# Patient Record
Sex: Male | Born: 1990 | Hispanic: Refuse to answer | Marital: Married | Smoking: Former smoker
Health system: Southern US, Community
[De-identification: ages and names within clinical notes are randomized; demographics above are authoritative.]

## PROBLEM LIST (undated history)

## (undated) DIAGNOSIS — R519 Headache, unspecified: Secondary | ICD-10-CM

## (undated) DIAGNOSIS — U071 COVID-19: Secondary | ICD-10-CM

## (undated) DIAGNOSIS — R51 Headache: Secondary | ICD-10-CM

## (undated) DIAGNOSIS — F329 Major depressive disorder, single episode, unspecified: Secondary | ICD-10-CM

## (undated) DIAGNOSIS — F32A Depression, unspecified: Secondary | ICD-10-CM

## (undated) HISTORY — PX: NO PAST SURGERIES: SHX2092

## (undated) HISTORY — DX: Depression, unspecified: F32.A

## (undated) HISTORY — DX: COVID-19: U07.1

## (undated) HISTORY — DX: Headache, unspecified: R51.9

## (undated) HISTORY — DX: Headache: R51

## (undated) HISTORY — DX: Major depressive disorder, single episode, unspecified: F32.9

---

## 2012-12-22 ENCOUNTER — Emergency Department: Payer: Self-pay | Admitting: Emergency Medicine

## 2013-12-08 ENCOUNTER — Emergency Department: Payer: Self-pay | Admitting: Emergency Medicine

## 2014-04-03 ENCOUNTER — Emergency Department (HOSPITAL_COMMUNITY)
Admission: EM | Admit: 2014-04-03 | Discharge: 2014-04-03 | Disposition: A | Discharge status: Home / Self Care | Payer: No Typology Code available for payment source | Attending: Dermatology | Admitting: Dermatology

## 2014-04-03 ENCOUNTER — Encounter (HOSPITAL_COMMUNITY): Payer: Self-pay | Admitting: Emergency Medicine

## 2014-04-03 DIAGNOSIS — F419 Anxiety disorder, unspecified: Secondary | ICD-10-CM | POA: Insufficient documentation

## 2014-04-03 DIAGNOSIS — F329 Major depressive disorder, single episode, unspecified: Secondary | ICD-10-CM | POA: Diagnosis not present

## 2014-04-03 DIAGNOSIS — F32A Depression, unspecified: Secondary | ICD-10-CM

## 2014-04-03 DIAGNOSIS — Z79899 Other long term (current) drug therapy: Secondary | ICD-10-CM | POA: Insufficient documentation

## 2014-04-03 DIAGNOSIS — Z720 Tobacco use: Secondary | ICD-10-CM | POA: Diagnosis not present

## 2014-04-03 DIAGNOSIS — Z008 Encounter for other general examination: Secondary | ICD-10-CM | POA: Diagnosis present

## 2014-04-03 LAB — RAPID URINE DRUG SCREEN, HOSP PERFORMED
Amphetamines: NOT DETECTED
BENZODIAZEPINES: NOT DETECTED
Barbiturates: NOT DETECTED
Cocaine: NOT DETECTED
Opiates: NOT DETECTED
TETRAHYDROCANNABINOL: POSITIVE — AB

## 2014-04-03 LAB — COMPREHENSIVE METABOLIC PANEL
ALT: 18 U/L (ref 0–53)
AST: 27 U/L (ref 0–37)
Albumin: 4.9 g/dL (ref 3.5–5.2)
Alkaline Phosphatase: 64 U/L (ref 39–117)
Anion gap: 9 (ref 5–15)
BUN: 9 mg/dL (ref 6–23)
CALCIUM: 9.2 mg/dL (ref 8.4–10.5)
CO2: 26 mmol/L (ref 19–32)
Chloride: 103 mmol/L (ref 96–112)
Creatinine, Ser: 0.92 mg/dL (ref 0.50–1.35)
GFR calc Af Amer: >90 mL/min (ref >90)
GFR calc non Af Amer: >90 mL/min (ref >90)
Glucose, Bld: 100 mg/dL — ABNORMAL HIGH (ref 70–99)
POTASSIUM: 3.5 mmol/L (ref 3.5–5.1)
Sodium: 138 mmol/L (ref 135–145)
Total Bilirubin: 0.7 mg/dL (ref 0.3–1.2)
Total Protein: 8.2 g/dL (ref 6.0–8.3)

## 2014-04-03 LAB — CBC
HEMATOCRIT: 49 % (ref 39.0–52.0)
Hemoglobin: 16.6 g/dL (ref 13.0–17.0)
MCH: 31.9 pg (ref 26.0–34.0)
MCHC: 33.9 g/dL (ref 30.0–36.0)
MCV: 94 fL (ref 78.0–100.0)
Platelets: 315 10*3/uL (ref 150–400)
RBC: 5.21 MIL/uL (ref 4.22–5.81)
RDW: 12.5 % (ref 11.5–15.5)
WBC: 10 10*3/uL (ref 4.0–10.5)

## 2014-04-03 LAB — ETHANOL: Alcohol, Ethyl (B): <5 mg/dL (ref 0–9)

## 2014-04-03 LAB — SALICYLATE LEVEL

## 2014-04-03 LAB — ACETAMINOPHEN LEVEL: Acetaminophen (Tylenol), Serum: <10 ug/mL — ABNORMAL LOW (ref 10–30)

## 2014-04-03 MED ORDER — IBUPROFEN 200 MG PO TABS: 600.0000 mg | ORAL_TABLET | Freq: Three times a day (TID) | ORAL | Status: DC | PRN

## 2014-04-03 MED ORDER — LORAZEPAM 1 MG PO TABS: 1.0000 mg | ORAL_TABLET | Freq: Three times a day (TID) | ORAL | Status: DC | PRN

## 2014-04-03 MED ORDER — ACETAMINOPHEN 325 MG PO TABS: 650.0000 mg | ORAL_TABLET | ORAL | Status: DC | PRN

## 2014-04-03 NOTE — ED Provider Notes (Addendum)
CSN: [id number]     Arrival date & time 04/03/14  1306 History   First MD Initiated Contact with Patient 04/03/14 1506     Chief Complaint  Patient presents with  . Medical Clearance     (Consider location/radiation/quality/duration/timing/severity/associated sxs/prior Treatment) The history is provided by the patient.  pt with intermittent hx of feelings of anxiety and depression (although no specific beh health dx in past), c/o feeling more depressed in the past couple months. Notes recent stressors related to series of small poor decisions, also a DUI, breakup of relationship, and loss of good job and current unrewarding job.  Occasional THC use. Rare binge etoh use. States has had random, fleeting thoughts of suicide in past, but denies plan or any attempt to harm self.  States physical health at baseline. Is eating/drinking. No wt change. No recent febrile illness. No headaches.       History reviewed. No pertinent past medical history. History reviewed. No pertinent past surgical history. History reviewed. No pertinent family history. History  Substance Use Topics  . Smoking status: Current Every Day Smoker -- 1.00 packs/day    Types: Cigarettes  . Smokeless tobacco: Not on file  . Alcohol Use: 1.8 oz/week    3 Cans of beer per week    Review of Systems  Constitutional: Negative for fever.  HENT: Negative for sore throat.   Eyes: Negative for redness.  Respiratory: Negative for shortness of breath.   Cardiovascular: Negative for chest pain.  Gastrointestinal: Negative for vomiting and abdominal pain.  Endocrine: Negative for polyuria.  Genitourinary: Negative for flank pain.  Musculoskeletal: Negative for back pain and neck pain.  Skin: Negative for rash.  Neurological: Negative for headaches.  Hematological: Does not bruise/bleed easily.  Psychiatric/Behavioral: Positive for dysphoric mood. Negative for self-injury.      Allergies  Review of patient's allergies  indicates no known allergies.  Home Medications   Prior to Admission medications   Medication Sig Start Date End Date Taking? Authorizing Provider  Ascorbic Acid 125 MG CHEW Chew 250 mg by mouth daily.   Yes Historical Provider, MD  hydroxypropyl methylcellulose / hypromellose (ISOPTO TEARS / GONIOVISC) 2.5 % ophthalmic solution Place 1 drop into both eyes 3 (three) times daily as needed for dry eyes.   Yes Historical Provider, MD   BP 142/97 mmHg  Pulse 83  Temp(Src) 97 F (36.1 C) (Oral)  Resp 17  SpO2 100% Physical Exam  Constitutional: He is oriented to person, place, and time. He appears well-developed and well-nourished. No distress.  HENT:  Head: Atraumatic.  Eyes: Conjunctivae are normal. No scleral icterus.  Neck: Neck supple. No tracheal deviation present.  Cardiovascular: Normal rate, regular rhythm, normal heart sounds and intact distal pulses.   Pulmonary/Chest: Effort normal and breath sounds normal. No accessory muscle usage. No respiratory distress.  Abdominal: He exhibits no distension.  Musculoskeletal: Normal range of motion. He exhibits no edema.  Neurological: He is alert and oriented to person, place, and time.  Steady gait. No tremor or shakes.   Skin: Skin is warm and dry.  Psychiatric:  Depressed mood.   Nursing note and vitals reviewed.   ED Course  Procedures (including critical care time) Labs Review   Results for orders placed or performed during the hospital encounter of 04/03/14  Acetaminophen level  Result Value Ref Range   Acetaminophen (Tylenol), Serum <10.0 (L) 10 - 30 ug/mL  CBC  Result Value Ref Range   WBC 10.0 4.0 -  10.5 K/uL   RBC 5.21 4.22 - 5.81 MIL/uL   Hemoglobin 16.6 13.0 - 17.0 g/dL   HCT 40.9 81.1 - 91.4 %   MCV 94.0 78.0 - 100.0 fL   MCH 31.9 26.0 - 34.0 pg   MCHC 33.9 30.0 - 36.0 g/dL   RDW 78.2 95.6 - 21.3 %   Platelets 315 150 - 400 K/uL  Comprehensive metabolic panel  Result Value Ref Range   Sodium 138 135 -  145 mmol/L   Potassium 3.5 3.5 - 5.1 mmol/L   Chloride 103 96 - 112 mmol/L   CO2 26 19 - 32 mmol/L   Glucose, Bld 100 (H) 70 - 99 mg/dL   BUN 9 6 - 23 mg/dL   Creatinine, Ser 0.86 0.50 - 1.35 mg/dL   Calcium 9.2 8.4 - 57.8 mg/dL   Total Protein 8.2 6.0 - 8.3 g/dL   Albumin 4.9 3.5 - 5.2 g/dL   AST 27 0 - 37 U/L   ALT 18 0 - 53 U/L   Alkaline Phosphatase 64 39 - 117 U/L   Total Bilirubin 0.7 0.3 - 1.2 mg/dL   GFR calc non Af Amer >90 >90 mL/min   GFR calc Af Amer >90 >90 mL/min   Anion gap 9 5 - 15  Ethanol (ETOH)  Result Value Ref Range   Alcohol, Ethyl (B) <5 0 - 9 mg/dL  Salicylate level  Result Value Ref Range   Salicylate Lvl <4.0 2.8 - 20.0 mg/dL  Urine Drug Screen  Result Value Ref Range   Opiates NONE DETECTED NONE DETECTED   Cocaine NONE DETECTED NONE DETECTED   Benzodiazepines NONE DETECTED NONE DETECTED   Amphetamines NONE DETECTED NONE DETECTED   Tetrahydrocannabinol POSITIVE (A) NONE DETECTED   Barbiturates NONE DETECTED NONE DETECTED      MDM   Labs.  Reviewed nursing notes and prior charts for additional history.   Psych team consulted -  Disposition per psych team.  Pt moved to TCU with psych holding orders.   Psych team has assessed - they indicate although episodic SI in past, no current plan to harm self/others, and that patient contracts for safety.  They indicate they will make arrangements for patient to enter intensive outpatient program Monday.  Pt states he is here because he wants to feel and act better, and indicates he has no plan to harm self, no current suicidal thoughts, and is agreeable to, and optimistic about, outpatient follow up plan.   Prior to d/c, pt asks for sleep aide for home.  Recommend mild, otc, sleep medication on prn basis only, and will provide instructional handout.  Pt currently appears stable for d/c.  Return precautions discussed incl return to ER right away if worse, worsening depression, suicidal thoughts, medical  emergency, of if unable to facilitate outpt program Monday.     Cathren Laine, MD 04/03/14 [phone]

## 2014-04-03 NOTE — ED Notes (Signed)
Pt reports severe depression recently and some "stupid suicidal thoughts-nothing I've acted on. I've been forgetting stuff like if I'm supposed to be somewhere or call someone back. I'm just mentally cloudy." When asked if depression was new he said "it has been about 6 months of a fairly constant downward deal-I've actually been feeling better the last couple days." Not currently on depression medication or any type of medication. Reports, "I smoke a lot of pot." Works currently and says, "I had a really nice job a few months ago but I got a DUI and I lost the job. Now I work a [employer] job with BS pay and I know that's an influencing factor with my mental state." "Everything has been going downhill since August when my relationship ended. I never sought professional psychiatric help." Denies suicidal ideations currently but has "had those thoughts." Denies homicidal ideations. Pt is A&Ox4. Denies marijuana use today.

## 2014-04-03 NOTE — Discharge Instructions (Signed)
It was our pleasure to provide your ER care today - we hope that you feel better.  Follow up with the Intensive Outpatient Program Monday as arranged - call Monday morning to facilitate.   For sleep, see Insomnia instructions below.  You may try over the counter sleep aide as need to help with sleep (available at [facility], [facility], [employer], etc).  For mental health crisis, you may go directly to [facility].  Return to ER if worse, new symptoms, worsening or severe depression, suicidal thoughts or plan, medical emergency, other concern.        Depression Depression refers to feeling sad, low, down in the dumps, blue, gloomy, or empty. In general, there are two kinds of depression:  Normal sadness or normal grief. This kind of depression is one that we all feel from time to time after upsetting life experiences, such as the loss of a job or the ending of a relationship. This kind of depression is considered normal, is short lived, and resolves within a few days to 2 weeks. Depression experienced after the loss of a loved one (bereavement) often lasts longer than 2 weeks but normally gets better with time.  Clinical depression. This kind of depression lasts longer than normal sadness or normal grief or interferes with your ability to function at home, at work, and in school. It also interferes with your personal relationships. It affects almost every aspect of your life. Clinical depression is an illness. Symptoms of depression can also be caused by conditions other than those mentioned above, such as:  Physical illness. Some physical illnesses, including underactive thyroid gland (hypothyroidism), severe anemia, specific types of cancer, diabetes, uncontrolled seizures, heart and lung problems, strokes, and chronic pain are commonly associated with symptoms of depression.  Side effects of some prescription medicine. In some people, certain types of medicine can cause symptoms of  depression.  Substance abuse. Abuse of alcohol and illicit drugs can cause symptoms of depression. SYMPTOMS Symptoms of normal sadness and normal grief include the following:  Feeling sad or crying for short periods of time.  Not caring about anything (apathy).  Difficulty sleeping or sleeping too much.  No longer able to enjoy the things you used to enjoy.  Desire to be by oneself all the time (social isolation).  Lack of energy or motivation.  Difficulty concentrating or remembering.  Change in appetite or weight.  Restlessness or agitation. Symptoms of clinical depression include the same symptoms of normal sadness or normal grief and also the following symptoms:  Feeling sad or crying all the time.  Feelings of guilt or worthlessness.  Feelings of hopelessness or helplessness.  Thoughts of suicide or the desire to harm yourself (suicidal ideation).  Loss of touch with reality (psychotic symptoms). Seeing or hearing things that are not real (hallucinations) or having false beliefs about your life or the people around you (delusions and paranoia). DIAGNOSIS  The diagnosis of clinical depression is usually based on how bad the symptoms are and how long they have lasted. Your health care provider will also ask you questions about your medical history and substance use to find out if physical illness, use of prescription medicine, or substance abuse is causing your depression. Your health care provider may also order blood tests. TREATMENT  Often, normal sadness and normal grief do not require treatment. However, sometimes antidepressant medicine is given for bereavement to ease the depressive symptoms until they resolve. The treatment for clinical depression depends on how bad the  symptoms are but often includes antidepressant medicine, counseling with a mental health professional, or both. Your health care provider will help to determine what treatment is best for  you. Depression caused by physical illness usually goes away with appropriate medical treatment of the illness. If prescription medicine is causing depression, talk with your health care provider about stopping the medicine, decreasing the dose, or changing to another medicine. Depression caused by the abuse of alcohol or illicit drugs goes away when you stop using these substances. Some adults need professional help in order to stop drinking or using drugs. SEEK IMMEDIATE MEDICAL CARE IF:  You have thoughts about hurting yourself or others.  You lose touch with reality (have psychotic symptoms).  You are taking medicine for depression and have a serious side effect. FOR MORE INFORMATION  National Alliance on Mental Illness: www.nami.[employer] of Mental Health: http://www.maynard.net/ Document Released: 12/30/1999 Document Revised: 05/18/2013 Document Reviewed: 04/02/2011 [facility] Patient Information 2015 [place], [place]. This information is not intended to replace advice given to you by your health care provider. Make sure you discuss any questions you have with your health care provider.    Generalized Anxiety Disorder Generalized anxiety disorder (GAD) is a mental disorder. It interferes with life functions, including relationships, work, and school. GAD is different from normal anxiety, which everyone experiences at some point in their lives in response to specific life events and activities. Normal anxiety actually helps [place] prepare for and get through these life events and activities. Normal anxiety goes away after the event or activity is over.  GAD causes anxiety that is not necessarily related to specific events or activities. It also causes excess anxiety in proportion to specific events or activities. The anxiety associated with GAD is also difficult to control. GAD can vary from mild to severe. People with severe GAD can have intense waves of anxiety with physical symptoms  (panic attacks).  SYMPTOMS The anxiety and worry associated with GAD are difficult to control. This anxiety and worry are related to many life events and activities and also occur more days than not for 6 months or longer. People with GAD also have three or more of the following symptoms (one or more in children):  Restlessness.   Fatigue.  Difficulty concentrating.   Irritability.  Muscle tension.  Difficulty sleeping or unsatisfying sleep. DIAGNOSIS GAD is diagnosed through an assessment by your health care provider. Your health care provider will ask you questions aboutyour mood,physical symptoms, and events in your life. Your health care provider may ask you about your medical history and use of alcohol or drugs, including prescription medicines. Your health care provider may also do a physical exam and blood tests. Certain medical conditions and the use of certain substances can cause symptoms similar to those associated with GAD. Your health care provider may refer you to a mental health specialist for further evaluation. TREATMENT The following therapies are usually used to treat GAD:   Medication. Antidepressant medication usually is prescribed for long-term daily control. Antianxiety medicines may be added in severe cases, especially when panic attacks occur.   Talk therapy (psychotherapy). Certain types of talk therapy can be helpful in treating GAD by providing support, education, and guidance. A form of talk therapy called cognitive behavioral therapy can teach you healthy ways to think about and react to daily life events and activities.  Stress managementtechniques. These include yoga, meditation, and exercise and can be very helpful when they are practiced regularly. A mental  health specialist can help determine which treatment is best for you. Some people see improvement with one therapy. However, other people require a combination of therapies. Document Released:  04/28/2012 Document Revised: 05/18/2013 Document Reviewed: 04/28/2012 [facility] Patient Information 2015 [place], [place]. This information is not intended to replace advice given to you by your health care provider. Make sure you discuss any questions you have with your health care provider.    Insomnia Insomnia is frequent trouble falling and/or staying asleep. Insomnia can be a long term problem or a short term problem. Both are common. Insomnia can be a short term problem when the wakefulness is related to a certain stress or worry. Long term insomnia is often related to ongoing stress during waking hours and/or poor sleeping habits. Overtime, sleep deprivation itself can make the problem worse. Every little thing feels more severe because you are overtired and your ability to cope is decreased. CAUSES   Stress, anxiety, and depression.  Poor sleeping habits.  Distractions such as TV in the bedroom.  Naps close to bedtime.  Engaging in emotionally charged conversations before bed.  Technical reading before sleep.  Alcohol and other sedatives. They may make the problem worse. They can hurt normal sleep patterns and normal dream activity.  Stimulants such as caffeine for several hours prior to bedtime.  Pain syndromes and shortness of breath can cause insomnia.  Exercise late at night.  Changing time zones may cause sleeping problems (jet lag). It is sometimes helpful to have someone observe your sleeping patterns. They should look for periods of not breathing during the night (sleep apnea). They should also look to see how long those periods last. If you live alone or observers are uncertain, you can also be observed at a sleep clinic where your sleep patterns will be professionally monitored. Sleep apnea requires a checkup and treatment. Give your caregivers your medical history. Give your caregivers observations your family has made about your sleep.  SYMPTOMS   Not feeling rested  in the morning.  Anxiety and restlessness at bedtime.  Difficulty falling and staying asleep. TREATMENT   Your caregiver may prescribe treatment for an underlying medical disorders. Your caregiver can give advice or help if you are using alcohol or other drugs for self-medication. Treatment of underlying problems will usually eliminate insomnia problems.  Medications can be prescribed for short time use. They are generally not recommended for lengthy use.  Over-the-counter sleep medicines are not recommended for lengthy use. They can be habit forming.  You can promote easier sleeping by making lifestyle changes such as:  Using relaxation techniques that help with breathing and reduce muscle tension.  Exercising earlier in the day.  Changing your diet and the time of your last meal. No night time snacks.  Establish a regular time to go to bed.  Counseling can help with stressful problems and worry.  Soothing music and white noise may be helpful if there are background noises you cannot remove.  Stop tedious detailed work at least one hour before bedtime. HOME CARE INSTRUCTIONS   Keep a diary. Inform your caregiver about your progress. This includes any medication side effects. See your caregiver regularly. Take note of:  Times when you are asleep.  Times when you are awake during the night.  The quality of your sleep.  How you feel the next day. This information will help your caregiver care for you.  Get out of bed if you are still awake after 15 minutes. Read or  do some quiet activity. Keep the lights down. Wait until you feel sleepy and go back to bed.  Keep regular sleeping and waking hours. Avoid naps.  Exercise regularly.  Avoid distractions at bedtime. Distractions include watching television or engaging in any intense or detailed activity like attempting to balance the household checkbook.  Develop a bedtime ritual. Keep a familiar routine of bathing, brushing  your teeth, climbing into bed at the same time each night, listening to soothing music. Routines increase the success of falling to sleep faster.  Use relaxation techniques. This can be using breathing and muscle tension release routines. It can also include visualizing peaceful scenes. You can also help control troubling or intruding thoughts by keeping your mind occupied with boring or repetitive thoughts like the old concept of counting sheep. You can make it more creative like imagining planting one beautiful flower after another in your backyard garden.  During your day, work to eliminate stress. When this is not possible use some of the previous suggestions to help reduce the anxiety that accompanies stressful situations. MAKE SURE YOU:   Understand these instructions.  Will watch your condition.  Will get help right away if you are not doing well or get worse. Document Released: 12/30/1999 Document Revised: 03/26/2011 Document Reviewed: 01/29/2007 [facility] Patient Information 2015 [place], [place]. This information is not intended to replace advice given to you by your health care provider. Make sure you discuss any questions you have with your health care provider.       Emergency Department Resource Guide 1) Find a Doctor and Pay Out of Pocket Although you won't have to find out who is covered by your insurance plan, it is a good idea to ask around and get recommendations. You will then need to call the office and see if the doctor you have chosen will accept you as a new patient and what types of options they offer for patients who are self-pay. Some doctors offer discounts or will set up payment plans for their patients who do not have insurance, but you will need to ask so you aren't surprised when you get to your appointment.  2) Contact Your Local Health Department Not all health departments have doctors that can see patients for sick visits, but many do, so it is worth a call  to see if yours does. If you don't know where your local health department is, you can check in your phone book. The CDC also has a tool to help you locate your state's health department, and many state websites also have listings of all of their local health departments.  3) Find a Walk-in Clinic If your illness is not likely to be very severe or complicated, you may want to try a walk in clinic. These are popping up all over the country in pharmacies, drugstores, and shopping centers. They're usually staffed by nurse practitioners or physician assistants that have been trained to treat common illnesses and complaints. They're usually fairly quick and inexpensive. However, if you have serious medical issues or chronic medical problems, these are probably not your best option.  No Primary Care Doctor: - Call Health Connect at  [phone] - they can help you locate a primary care doctor that  accepts your insurance, provides certain services, etc. - Physician Referral Service- [phone]  Chronic Pain Problems: Organization         Address  Phone   Notes  [facility]  [phone] Patients need to  be referred by their primary care doctor.   Medication Assistance: Organization         Address  Phone   Notes  [facility] Medication [facility] [address] [place] [place], [place] [address] [phone] --Must be a resident of [facility] -- Must have NO insurance coverage whatsoever (no Medicaid/ Medicare, etc.) -- The pt. MUST have a primary care doctor that directs their care regularly and follows them in the community   [facility]  [phone]   [employer]  [phone]    Agencies that provide inexpensive medical care: Organization         Address  Phone   Notes  [facility]  [phone]   [facility]    [phone]   [facility] [address] [place], [place] [address] [phone]   [facility] [address] [place] [place] [phone]   [facility]anned Parenthood    [phone]   [facility]    [phone]   Community Health and [facility]  [address], [place] Phone:  [phone], Fax:  [phone] Hours of Operation:  9 am - 6 pm, M-F.  Also accepts Medicaid/Medicare and self-pay.  [facility]  [address], [place] Phone: [phone], Fax: [phone]. Hours of Operation:  8:30 am - 5:30 pm, M-F.  Also accepts Medicaid and self-pay.  [facility] [place] [address] [place] Phone: [phone]   [facility] [address] Natasha Bence [place], [place] [phone], Ext. [phone] Mondays & Thursdays: 7-9 AM.  First 15 patients are seen on a first come, first serve basis.    Medicaid-accepting [facility] Providers:  Organization         Address  Phone   Notes  [facility] [address], [place] [phone] Also accepts self-pay patients.  [facility] [address] Laurell Josephs [place], [place]  [phone]   [facility] [address], [place] [phone]   [facility] [address] [place] [phone]   Renaye Rakers [address], [place]   [phone] Only accepts [place] Access [place] patients after they have their name applied to their card.   Self-Pay (no insurance) in [facility]:  Organization         Address  Phone   Notes  Sickle Cell Patients, [facility] Medicine [address] [place], [place] [phone]   [facility] [address] [place], [place] [phone]   [facility] [place]  [address], [place] [phone]   [facility]/Dr. Osei-Bonsu  [address], [place] or [address], [place] [phone] Phone number for both [place] and [place] locations is the same.  Urgent Medical and [facility] [address], [place] [phone]   [facility] [address], [place] or [address] [phone]   [facility] [address], [place] [phone], phone; [phone], fax Sees patients 1st and 3rd Saturday of every month.  Must not qualify for public or private insurance (i.e. Medicaid, Medicare, [place] Health Choice, Veterans' Benefits)  Household income should be  no more than 200% of the poverty level The clinic cannot treat you if you are pregnant or think you are pregnant  Sexually transmitted diseases are not treated at the clinic.    Dental Care: Organization         Address  Phone  Notes  [facility] Department of [facility] [address] [place], [place] [phone] Accepts children up to age 75 who are enrolled in [place] or [place] Health Choice; pregnant women with a Medicaid card; and children who have applied for Medicaid or [place] Health Choice, but were declined, whose parents can pay a reduced fee at time of service.  [facility] of [facility]  [address], [place] [phone] Accepts children up to age 78 who are enrolled in [place] or [place] Health Choice; pregnant women with a Medicaid card; and children who have applied for Medicaid or [place] Health Choice, but were declined, whose parents can pay a reduced fee at time of service.  [facility]  [address] [place], [place] [phone] Patients are seen by appointment only. Walk-ins are not accepted. [facility] will see patients 23 years of age and older. Monday - Tuesday (8am-5pm) Most Wednesdays (8:30-5pm) $30 per visit, cash only  [facility] Adult [facility] Access [facility]  [address] [place] [address], [facility] [phone] Patients are  seen by appointment only. Walk-ins are not accepted. [facility] will see patients 76 years of age and older. One Wednesday Evening (Monthly: Volunteer Based).  $30 per visit, cash only  [employer]  [phone] for adults; Children under age 25, call Graduate Pediatric Dentistry at [phone]. Children aged 73-14, please call [phone] to request a pediatric application.  Dental services are provided in all areas of dental care including fillings, crowns and bridges, complete and partial dentures, implants, gum treatment, root canals, and extractions. Preventive care is also provided. Treatment is provided to both adults and children. Patients are selected via a lottery and there is often a waiting list.   [facility] [address], [place]  [phone] [email or web address]   [facility] [address] [place], [place] [phone], Ext. [phone] Second and Fourth Thursday of each month, opens at 6:30 AM; Clinic ends at 9 AM.  Patients are seen on a first-come first-served basis, and a limited number are seen during each clinic.   [facility]  [address] Ether Griffins [place], [place] [phone]   Eligibility Requirements You must have lived in [place], [place], or [place] counties for at least the last three months.   You cannot be eligible for state or federal sponsored [place], including [employer], [place], or [employer].   You generally cannot be eligible for healthcare insurance through your employer.    How to apply: Eligibility screenings are held every Tuesday and Wednesday afternoon from 1:00 pm until 4:00 pm. You do not need an appointment for the interview!  [facility] [address] [place], [place] [phone]   [facility] Health Department  [phone]   [facility] Health Department  [phone]   [facility] Health Department  [phone]     Behavioral Health Resources in the Community: Intensive Outpatient Programs Organization         Address  Phone  Notes  [facility] Services [address], [place], [place] [phone]  [facility] Outpatient [address] [place], [place] [phone]   [facility] [address] [place], [place]  [phone]   [facility] Mental Health [address] [place] [address]  [place], [place] [phone] or [phone]   Substance Abuse Resources Organization         Address  Phone  Notes  Al[facility] Drug Services  [phone]   [facility]  [phone]   [facility] [place]  [phone]   Floydene Flock  [phone]   Residential & Outpatient Substance Abuse Program  [phone]   Psychological Services Organization         Address  Phone  Notes  [facility] Behavioral Health  [phone]   [facility] Services  [phone]   [facility] Mental Health [address], [place] [phone] or [phone]    Mobile Crisis Teams Organization         Address  Phone  Notes  [facility], Mobile Crisis Care Unit  [phone]   [facility]  [address] [place], [place] [phone]   Doristine Locks [address] [place] [phone]    Self-Help/Support Groups Organization         Address  Phone             Notes  [facility] [place] - variety of support groups  [phone]- [phone] Call for more information  Narcotics Anonymous (NA), [facility] [address], [employer] [place]  2 meetings at this location   Statistician         Address  Phone  Notes  [facility] [address],    [place]  [phone]   [facility]  [address] [place] [address], [place], [place] [phone]   [facility] [address] [address] [place], [place] [phone] Admissions: 8am-3pm M-F  Incentives  [facility] [address],    [place], [place] [phone]   [facility] [address] [place], [place], [place] [phone]   [facility] [address]  [place], [place] [phone]   [facility] - Intensive Outpatient [address], [place], [place] [phone]   [facility] [address] [place]  [place], [place] [phone] or [phone]   [facility] [address], [place], [place] [phone] Accepts Medicaid  [facility] [address]  [place] [phone] Substance Abuse/Addiction Treatment   [facility] Organization         Address  Phone  Notes  [facility]  [phone]   Angie Fava, PhD [address]vin Knack [place], [place]   [phone] or [phone]   [facility]   [address] [place], [place] [phone]   [facility] [address], [place], [place] [phone] Insurance/Medicaid/sponsorship through [facility] and Families [address]                                    [place], [place] [phone] Therapy/tele-psych/case  [facility] [address], [place] [phone]    Dr. Lolly Mustache  [phone]   [facility]  [employer] [facility] Dept. 1) [address]  2) [address], [place] 3)  [place], [place] [phone]  [phone]   [facility] Child Abuse Hotline [phone] or [phone] (After Hours)

## 2014-04-03 NOTE — BH Assessment (Signed)
Assessment Note  Steve Benjamin is an 24 y.o. male presenting to [facility] with severe depression. Patient sts, "I've had bouts of depression but it typically goes away". Patient's new depressive symptoms started 6 months ago. Symptoms triggered by a breakup with a girlfriend, DUI, loosing a great job, and disliking his new job. Pt sts, "'Ive been forgetting stuff like if I'm supposed to be somewhere or call someone back". Patient admits that he has issues with ADD but feels,"My mind is everywhere and I can't focus".  Patient describes his current symptoms as, "Mentally cloudy". Patient reports a increase in anxiety and a possible panic attack several days ago. Patient denies suicidal thoughts. He is able to contract for safety. No self mutilating behaviors. He admits to cutting in the past (age 89) but denies any recent/current incidences. No HI or AVH's.  Not currently on psychotrophic medications. He does not reports any outpatient mental health providers.. Reports, "I smoke a lot of pot." He also drinks alcohol 4-5x's per week.         Axis I: Depressive Disorder NOS, Anxiety Disorder NOS, Canabis Abuse Axis II: Deferred Axis III: History reviewed. No pertinent past medical history. Axis IV: other psychosocial or environmental problems, problems related to social environment, problems with access to health care services and problems with primary support group Axis V: 31-40 impairment in reality testing  Past Medical History: History reviewed. No pertinent past medical history.  History reviewed. No pertinent past surgical history.  Family History: History reviewed. No pertinent family history.  Social History:  reports that he has been smoking Cigarettes.  He has been smoking about 1.00 pack per day. He does not have any smokeless tobacco history on file. He reports that he drinks about 1.8 oz of alcohol per week. He reports that he uses illicit drugs (Marijuana).  Additional Social History:   Alcohol / Drug Use Pain Medications: SEE MAR Prescriptions: SEE MAR Over the Counter: SEE MAR Substance #1 Name of Substance 1: THC  1 - Age of First Use: 24 yrs old  1 - Amount (size/oz): "Couple of grams" 1 - Frequency: daily  1 - Duration: on-going  1 - Last Use / Amount: "last night" Substance #2 Name of Substance 2: Alcohol  2 - Age of First Use: 24 yrs old  2 - Amount (size/oz): 2-4 beers  2 - Frequency: daily  2 - Duration: on-going  2 - Last Use / Amount: "last night"  CIWA: CIWA-Ar BP: 142/97 mmHg Pulse Rate: 83 COWS:    Allergies: No Known Allergies  Home Medications:  (Not in a hospital admission)  OB/GYN Status:  No LMP for male patient.  General Assessment Data Location of Assessment: [facility] Is this a Tele or Face-to-Face Assessment?: Face-to-Face Is this an Initial Assessment or a Re-assessment for this encounter?: Initial Assessment Living Arrangements: Other (Comment) ("I have several room mates") Can pt return to current living arrangement?: Yes Admission Status: Voluntary Is patient capable of signing voluntary admission?: Yes Transfer from: Acute Hospital Referral Source: Self/Family/Friend     [facility] Crisis Care Plan Living Arrangements: Other (Comment) ("I have several room mates") Name of Psychiatrist:  (No psychiatrist ) Name of Therapist:  (No therapist )     Risk to self with the past 6 months Suicidal Ideation: No-Not Currently/Within Last 6 Months Suicidal Intent: No Is patient at risk for suicide?: No Suicidal Plan?: No Access to Means: No Specify Access to Suicidal Means:  (none reported ) What has  been your use of drugs/alcohol within the last 12 months?:  (patient reports occas. alcohol and ddaily THC use ) Previous Attempts/Gestures: No How many times?:  (0) Other Self Harm Risks:  (none reported) Triggers for Past Attempts: Other (Comment) (no previous attempts/gestures ) Intentional Self Injurious Behavior:  (past history  of cutting (age 30); no incidences since) Family Suicide History: Unknown Recent stressful life event(s): Other (Comment) (break up 6 mo's ago, DUI, loss job, not happy w/ new job) Persecutory voices/beliefs?: No Depression: Yes Depression Symptoms: Feeling angry/irritable, Loss of interest in usual pleasures, Feeling worthless/self pity, Guilt, Fatigue, Isolating, Tearfulness, Insomnia, Despondent Substance abuse history and/or treatment for substance abuse?: No Suicide prevention information given to non-admitted patients: Not applicable  Risk to Others within the past 6 months Homicidal Ideation: No Thoughts of Harm to Others: No Current Homicidal Intent: No Current Homicidal Plan: No Access to Homicidal Means: No Identified Victim:  (n/a) History of harm to others?: No Assessment of Violence: None Noted Violent Behavior Description:  (Patient calm and cooperative ) Does patient have access to weapons?: No Criminal Charges Pending?: No Does patient have a court date: No  Psychosis Hallucinations: None noted Delusions: None noted  Mental Status Report Appearance/Hygiene: In scrubs Eye Contact: Good Motor Activity: Freedom of movement Speech: Logical/coherent Level of Consciousness: Alert Mood: Depressed Affect: Appropriate to circumstance Anxiety Level: Moderate Thought Processes: Coherent Judgement: Unimpaired Orientation: Person, Place, Time, Situation Obsessive Compulsive Thoughts/Behaviors: None  Cognitive Functioning Concentration: Decreased Memory: Recent Intact, Remote Intact IQ: Average Insight: Fair Impulse Control: Fair Appetite: Good Weight Loss:  (none reported ) Weight Gain:  (none reported ) Sleep: Decreased Total Hours of Sleep:  ("2-3 hrs per night") Vegetative Symptoms: None  ADLScreening [facility]) Patient's cognitive ability adequate to safely complete daily activities?: Yes Patient able to express need for assistance with  ADLs?: Yes Independently performs ADLs?: Yes (appropriate for developmental age)  Prior Inpatient Therapy Prior Inpatient Therapy: No Prior Therapy Dates:  (n/a) Prior Therapy Facilty/Provider(s):  (n/a) Reason for Treatment:  (n/a)  Prior Outpatient Therapy Prior Outpatient Therapy: No Prior Therapy Dates:  (n/a) Prior Therapy Facilty/Provider(s):  (n/a) Reason for Treatment:  (n/a)  ADL Screening (condition at time of admission) Patient's cognitive ability adequate to safely complete daily activities?: Yes Is the patient deaf or have difficulty hearing?: No Does the patient have difficulty seeing, even when wearing glasses/contacts?: No Does the patient have difficulty concentrating, remembering, or making decisions?: Yes Patient able to express need for assistance with ADLs?: Yes Does the patient have difficulty dressing or bathing?: No Independently performs ADLs?: Yes (appropriate for developmental age) Does the patient have difficulty walking or climbing stairs?: No Weakness of Legs: None Weakness of Arms/Hands: None  Home Assistive Devices/Equipment Home Assistive Devices/Equipment: None    Abuse/Neglect Assessment (Assessment to be complete while patient is alone) Physical Abuse: Denies Verbal Abuse: Denies Sexual Abuse: Denies Exploitation of patient/patient's resources: Denies Self-Neglect: Denies Values / Beliefs Cultural Requests During Hospitalization: None Spiritual Requests During Hospitalization: None   Advance Directives (For Healthcare) Does patient have an advance directive?: No    Additional Information 1:1 In Past 12 Months?: No CIRT Risk: No Elopement Risk: No Does patient have medical clearance?: Yes     Disposition:  Disposition Initial Assessment Completed for this Encounter: Yes Disposition of Patient: Other dispositions (Per Julieanne Cotton, NP no criteria. Pt to follow up with [facility])  On Site Evaluation by:   Reviewed with Physician:     Marina Goodell,  Margret Chanceoyka Mona 04/03/2014 4:43 PM
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
## 2016-08-10 ENCOUNTER — Ambulatory Visit (INDEPENDENT_AMBULATORY_CARE_PROVIDER_SITE_OTHER): Payer: 59 | Admitting: Family Medicine

## 2016-08-10 ENCOUNTER — Encounter: Payer: Self-pay | Admitting: Family Medicine

## 2016-08-10 VITALS — BP 110/90 | HR 80 | Temp 98.0°F | Wt 170.2 lb

## 2016-08-10 DIAGNOSIS — Z720 Tobacco use: Secondary | ICD-10-CM | POA: Diagnosis not present

## 2016-08-10 DIAGNOSIS — F419 Anxiety disorder, unspecified: Secondary | ICD-10-CM

## 2016-08-10 DIAGNOSIS — Z23 Encounter for immunization: Secondary | ICD-10-CM | POA: Diagnosis not present

## 2016-08-10 DIAGNOSIS — F32A Depression, unspecified: Secondary | ICD-10-CM | POA: Insufficient documentation

## 2016-08-10 DIAGNOSIS — F329 Major depressive disorder, single episode, unspecified: Secondary | ICD-10-CM | POA: Diagnosis not present

## 2016-08-10 MED ORDER — VARENICLINE TARTRATE 0.5 MG X 11 & 1 MG X 42 PO MISC: ORAL | 0 refills | Status: DC

## 2016-08-10 MED ORDER — VARENICLINE TARTRATE 1 MG PO TABS: 1.0000 mg | ORAL_TABLET | Freq: Two times a day (BID) | ORAL | 0 refills | Status: DC

## 2016-08-10 MED ORDER — CITALOPRAM HYDROBROMIDE 10 MG PO TABS: 10.0000 mg | ORAL_TABLET | Freq: Every day | ORAL | 1 refills | Status: DC

## 2016-08-10 NOTE — Assessment & Plan Note (Signed)
New problem. Starting Chantix.

## 2016-08-10 NOTE — Assessment & Plan Note (Signed)
New problem (to me). Patient interested in seeing a therapist as well as starting medication. Starting Celexa today. Arranging for him to see a therapist.

## 2016-08-10 NOTE — Patient Instructions (Signed)
Chantix as prescribed.  Celexa daily.  We will call about you seeing a therapist.  Take care  Dr. Adriana Simasook

## 2016-08-10 NOTE — Progress Notes (Signed)
Subjective:  Patient ID: Steve Benjamin, male    DOB: 1990/02/01  Age: 26 y.o. MRN: [id number]  CC: Smoking cessation, Anxiety/stress/depression  HPI Steve LeechJonathan Malbrough is a 26 y.o. male presents to the clinic today as a new patient with the above complaints.  Tobacco abuse  Patient reports that he smokes approximately 1 pack a day.  He has been smoking since he was in his teens.  He wants to quit.  Interested in Chantix.  Wants to discuss today.  Anxiety/depression  Patient has had ongoing stressors at home and at work.  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 He is expecting his first child.  Additionally, he is working almost 70 hours a week. He is also having to help take care of his elderly grandfather with Alzheimer's.  He is quite anxious and is very stressed. He's having difficulty focusing. Difficulty sleeping. Feels overwhelmed. Symptoms are severe. No interventions tried.  He would like to discuss treatment options today.  Of note, he has a history of depression   PMH, Surgical Hx, Family Hx, Social History reviewed and updated as below.  Past Medical History:  Diagnosis Date  . Depression   . Headache    Past Surgical History:  Procedure Laterality Date  . NO PAST SURGERIES     Family History  Problem Relation Age of Onset  . Arthritis Paternal Uncle   . Hyperlipidemia Maternal Grandfather   . Heart disease Maternal Grandfather   . Cancer Paternal Grandfather   . Heart disease Paternal Grandfather   . Alzheimer's disease Paternal Grandfather    Social History  Substance Use Topics  . Smoking status: Current Every Day Smoker    Packs/day: 1.00    Types: Cigarettes  . Smokeless tobacco: Former NeurosurgeonUser  . Alcohol use 1.8 oz/week    3 Cans of beer per week   Review of Systems  Psychiatric/Behavioral:       Sadness, anxiety, stress.  All other systems reviewed and are negative.   Objective:   Today's Vitals: BP 110/90 (BP Location: Left  Arm, Patient Position: Sitting, Cuff Size: Normal)   Pulse 80   Temp 98 F (36.7 C) (Oral)   Wt 170 lb 4 oz (77.2 kg)   SpO2 98%   Physical Exam  Constitutional: He is oriented to person, place, and time. He appears well-developed and well-nourished. No distress.  HENT:  Head: Normocephalic and atraumatic.  Nose: Nose normal.  Mouth/Throat: Oropharynx is clear and moist. No oropharyngeal exudate.  Normal TM's bilaterally.   Eyes: Conjunctivae are normal. No scleral icterus.  Neck: Neck supple.  Cardiovascular: Normal rate and regular rhythm.   No murmur heard. Pulmonary/Chest: Effort normal and breath sounds normal. He has no wheezes. He has no rales.  Abdominal: Soft. He exhibits no distension. There is no tenderness. There is no rebound and no guarding.  Musculoskeletal: Normal range of motion. He exhibits no edema.  Lymphadenopathy:    He has no cervical adenopathy.  Neurological: He is alert and oriented to person, place, and time.  Skin: Skin is warm and dry. No rash noted.  Psychiatric:  Flat affect; depressed mood. Anxious.  Vitals reviewed.  Assessment & Plan:   Problem List Items Addressed This Visit      Other   Anxiety and depression - Primary    New problem (to me). Patient interested in seeing a therapist as well as starting medication. Starting Celexa today. Arranging for him to see a therapist.  Relevant Orders   Ambulatory referral to Psychology   Tobacco abuse    New problem. Starting Chantix.       Other Visit Diagnoses    Need for diphtheria-tetanus-pertussis (Tdap) vaccine       Relevant Orders   Tdap vaccine greater than or equal to 7yo IM (Completed)      Meds ordered this encounter  Medications  . varenicline (CHANTIX STARTING MONTH PAK) 0.5 MG X 11 & 1 MG X 42 tablet    Sig: 0.5 mg by mouth once daily for 3 days, then increase to 0.5 mg twice daily for 4 days, then increase to 1 mg tablet twice daily.    Dispense:  53 tablet     Refill:  0  . varenicline (CHANTIX CONTINUING MONTH PAK) 1 MG tablet    Sig: Take 1 tablet (1 mg total) by mouth 2 (two) times daily.    Dispense:  120 tablet    Refill:  0  . citalopram (CELEXA) 10 MG tablet    Sig: Take 1 tablet (10 mg total) by mouth daily.    Dispense:  90 tablet    Refill:  1    Follow-up: PRN  Everlene OtherJayce Tajha Sammarco DO [facility] [place]

## 2016-10-12 ENCOUNTER — Telehealth: Payer: Self-pay | Admitting: *Deleted

## 2016-10-12 NOTE — Telephone Encounter (Signed)
Pt has requested call in reference to increasing citalopram  Pt contact [phone]

## 2016-10-12 NOTE — Telephone Encounter (Signed)
Patient saw Dr.Cook in July and was started on citalopram 10 mg. Patient sees Bambi 11/20/16 and establishes care with you 12/28/16. Patient would like to increase his dose.

## 2016-10-14 NOTE — Telephone Encounter (Signed)
Please check with the patient to see how his anxiety and depression is doing. See if he has had any benefit from the citalopram at its current dose. Thanks.

## 2016-10-15 NOTE — Telephone Encounter (Signed)
Left message to return call 

## 2016-10-22 NOTE — Telephone Encounter (Signed)
Pt called back returning your call. Please advise, thank you!  Call pt @ [phone]

## 2016-10-22 NOTE — Telephone Encounter (Signed)
Patient states he believes his anxiety has gotten a little bit better.

## 2016-10-22 NOTE — Telephone Encounter (Signed)
Left message to return call 

## 2016-10-24 MED ORDER — CITALOPRAM HYDROBROMIDE 20 MG PO TABS: 20.0000 mg | ORAL_TABLET | Freq: Every day | ORAL | 1 refills | Status: DC

## 2016-10-24 NOTE — Telephone Encounter (Signed)
Noted. Patient can increase to Celexa 20 mg daily. This was sent to his pharmacy. Needs to keep follow-up.

## 2016-10-25 NOTE — Telephone Encounter (Signed)
Patient notified

## 2016-11-01 ENCOUNTER — Ambulatory Visit
Admission: EM | Admit: 2016-11-01 | Discharge: 2016-11-01 | Disposition: A | Discharge status: Home / Self Care | Payer: Managed Care, Other (non HMO) | Attending: Family Medicine | Admitting: Family Medicine

## 2016-11-01 ENCOUNTER — Encounter: Payer: Self-pay | Admitting: Emergency Medicine

## 2016-11-01 DIAGNOSIS — R41840 Attention and concentration deficit: Secondary | ICD-10-CM

## 2016-11-01 NOTE — Discharge Instructions (Signed)
Try one of the other [place] locations or [facility].  Take care  Dr. Adriana Simasook

## 2016-11-01 NOTE — ED Triage Notes (Signed)
Patient states that he would like to be put on a medicine for his ADHD.  Patient reports struggling with focusing at work and anxiety.

## 2016-11-01 NOTE — ED Provider Notes (Signed)
[facility]    CSN: [id number] Arrival date & time: 11/01/16  1052  History   Chief Complaint Chief Complaint  Patient presents with  . Anxiety   HPI  26 year old male presents with the above complaint.  Patient states that he's had ongoing trouble with anxiety and difficulty focusing. He's had life stressors as he has recently had a baby. He has had less sleep. He struggles with anxiety. He states that his anxiety has been improved since starting Celexa. However, he is now bothered significantly by difficulty focusing. This is been an ongoing issue. This seems to be worse as of late. He states that he was on stimulants previously. He states that he was diagnosed with ADHD when he was younger. I have no record of this. Patient is requesting a prescription today.  Past Medical History:  Diagnosis Date  . Depression   . Headache    Patient Active Problem List   Diagnosis Date Noted  . Anxiety and depression 08/10/2016  . Tobacco abuse 08/10/2016   Past Surgical History:  Procedure Laterality Date  . NO PAST SURGERIES      Home Medications    Prior to Admission medications   Medication Sig Start Date End Date Taking? Authorizing Provider  citalopram (CELEXA) 20 MG tablet Take 1 tablet (20 mg total) by mouth daily. 10/24/16   Glori Luis, MD    Family History Family History  Problem Relation Age of Onset  . Arthritis Paternal Uncle   . Hyperlipidemia Maternal Grandfather   . Heart disease Maternal Grandfather   . Cancer Paternal Grandfather   . Heart disease Paternal Grandfather   . Alzheimer's disease Paternal Grandfather     Social History Social History  Substance Use Topics  . Smoking status: Current Every Day Smoker    Packs/day: 1.00    Types: Cigarettes  . Smokeless tobacco: Former Neurosurgeon  . Alcohol use 1.8 oz/week    3 Cans of beer per week     Allergies   Patient has no known allergies.   Review of Systems Review of Systems    Constitutional: Negative.   Psychiatric/Behavioral: Positive for decreased concentration. The patient is nervous/anxious.    Physical Exam Triage Vital Signs ED Triage Vitals  Enc Vitals Group     BP 11/01/16 1118 132/84     Pulse Rate 11/01/16 1118 73     Resp 11/01/16 1118 16     Temp 11/01/16 1118 98.6 F (37 C)     Temp Source 11/01/16 1118 Oral     SpO2 11/01/16 1118 100 %     Weight 11/01/16 1119 170 lb (77.1 kg)     Height 11/01/16 1119 5\' 9"  (1.753 m)     Head Circumference --      Peak Flow --      Pain Score 11/01/16 1119 0     Pain Loc --      Pain Edu? --      Excl. in GC? --    Updated Vital Signs BP 132/84 (BP Location: Left Arm)   Pulse 73   Temp 98.6 F (37 C) (Oral)   Resp 16   Ht 5\' 9"  (1.753 m)   Wt 170 lb (77.1 kg)   SpO2 100%   BMI 25.10 kg/m   Physical Exam  Constitutional: He is oriented to person, place, and time. He appears well-developed. No distress.  Cardiovascular: Normal rate and regular rhythm.   No murmur heard. Pulmonary/Chest: Effort  normal and breath sounds normal. He has no wheezes. He has no rales.  Neurological: He is alert and oriented to person, place, and time.  Psychiatric: He has a normal mood and affect.  Vitals reviewed.  UC Treatments / Results  Labs (all labs ordered are listed, but only abnormal results are displayed) Labs Reviewed - No data to display  EKG  EKG Interpretation None       Radiology No results found.  Procedures Procedures (including critical care time)  Medications Ordered in UC Medications - No data to display   Initial Impression / Assessment and Plan / UC Course  I have reviewed the triage vital signs and the nursing notes.  Pertinent labs & imaging results that were available during my care of the patient were reviewed by me and considered in my medical decision making (see chart for details).    26 year old male presents with trouble focusing/difficulty concentrating.  Established problem, worsening. I advised him that he needs a psychological evaluation prior to prescribing. I do not prescribe these medications at urgent care. Advised patient to follow-up with primary care provider.  Final Clinical Impressions(s) / UC Diagnoses   Final diagnoses:  Difficulty concentrating   New Prescriptions New Prescriptions   No medications on file   Controlled Substance Prescriptions [place] Controlled Substance Registry consulted? Not Applicable   Tommie SamsCook, Nigil Braman G, DO 11/01/16 1201

## 2016-11-20 ENCOUNTER — Ambulatory Visit: Payer: 59 | Admitting: Psychology

## 2016-12-28 ENCOUNTER — Encounter: Payer: Self-pay | Admitting: Family Medicine

## 2016-12-28 ENCOUNTER — Other Ambulatory Visit: Payer: Self-pay

## 2016-12-28 ENCOUNTER — Ambulatory Visit (INDEPENDENT_AMBULATORY_CARE_PROVIDER_SITE_OTHER): Payer: Managed Care, Other (non HMO) | Admitting: Family Medicine

## 2016-12-28 VITALS — BP 128/82 | HR 92 | Temp 98.2°F | Ht 67.5 in | Wt 172.0 lb

## 2016-12-28 DIAGNOSIS — F909 Attention-deficit hyperactivity disorder, unspecified type: Secondary | ICD-10-CM | POA: Insufficient documentation

## 2016-12-28 DIAGNOSIS — G4710 Hypersomnia, unspecified: Secondary | ICD-10-CM

## 2016-12-28 DIAGNOSIS — F419 Anxiety disorder, unspecified: Secondary | ICD-10-CM | POA: Diagnosis not present

## 2016-12-28 DIAGNOSIS — Z0001 Encounter for general adult medical examination with abnormal findings: Secondary | ICD-10-CM | POA: Diagnosis not present

## 2016-12-28 DIAGNOSIS — F329 Major depressive disorder, single episode, unspecified: Secondary | ICD-10-CM

## 2016-12-28 DIAGNOSIS — Z13220 Encounter for screening for lipoid disorders: Secondary | ICD-10-CM | POA: Diagnosis not present

## 2016-12-28 HISTORY — DX: Hypersomnia, unspecified: G47.10

## 2016-12-28 MED ORDER — LISDEXAMFETAMINE DIMESYLATE 30 MG PO CAPS
30.0000 mg | ORAL_CAPSULE | Freq: Every day | ORAL | 0 refills | Status: DC
Start: 2016-12-28 — End: 2017-01-28

## 2016-12-28 NOTE — Assessment & Plan Note (Signed)
Patient completed evaluation for this.  He is interested in starting on medication.  Will start on Vyvanse.  He will call us in several weeks to let us know how this is going.  If he has palpitations or appetite suppression he will let us know immediately.

## 2016-12-28 NOTE — Assessment & Plan Note (Signed)
Patient with some hypersomnia associated with snoring and possible apneic episodes.  I offered a sleep study and recommended this though he declined and opted to monitor.

## 2016-12-28 NOTE — Assessment & Plan Note (Signed)
Physical exam completed.  Lab work will be completed at a future date.  He will work on diet and exercise.  Congratulated on smoking cessation.

## 2016-12-28 NOTE — Assessment & Plan Note (Signed)
Improved.  Celexa can cause attention issues though his attention issues predated starting the Celexa.  We discussed continuing this for now given improvement.

## 2016-12-28 NOTE — Patient Instructions (Signed)
Nice to see you. Please work on exercising and monitoring your diet. We will have you return for fasting lab work. Please try the Vyvanse and give this several weeks to see if it makes a difference.  Please contact us at that time and let us know how you are doing.  If you develop appetite suppression please let us know. If you change your mind regarding sleep apnea test please let us know.

## 2016-12-28 NOTE — Progress Notes (Signed)
Tommi Rumps, MD Phone: [phone]  Steve Benjamin is a 26 y.o. male who presents today for CPE.  Not much exercise.  He is going to start working out with friends. Eats a fairly healthy diet.  Occasional fast food.  Lots of vegetables and chicken.  A couple of sodas weekly. Reports prior HIV testing. Tetanus vaccination up-to-date.  Flu vaccination up-to-date. No family history of prostate or colon cancer. Patient quit smoking in August with a 12-pack-year history.  6 alcoholic beverages weekly.  No illicit drug use.  Patient was recently evaluated for ADHD.  Had positive testing.  They recommended behavioral interventions and considering medication.  He reports he has been on medication previously.    Tolerated Vyvanse well.  He is interested in starting medication.  Patient notes he snores and possibly has had apneic episodes.  He does note a family history of sleep apnea.  Possibly some hypersomnia.  He does wake up unrested.  Anxiety: Patient notes no anxiety currently.  The Celexa has been very beneficial.   Active Ambulatory Problems    Diagnosis Date Noted  . Anxiety and depression 08/10/2016  . Tobacco abuse 08/10/2016  . Encounter for general adult medical examination with abnormal findings 12/28/2016  . ADHD 12/28/2016  . Hypersomnia 12/28/2016   Resolved Ambulatory Problems    Diagnosis Date Noted  . No Resolved Ambulatory Problems   Past Medical History:  Diagnosis Date  . Depression   . Headache     Family History  Problem Relation Age of Onset  . Arthritis Paternal Uncle   . Hyperlipidemia Maternal Grandfather   . Heart disease Maternal Grandfather   . Cancer Paternal Grandfather   . Heart disease Paternal Grandfather   . Alzheimer's disease Paternal Grandfather     Social History   Socioeconomic History  . Marital status: Single    Spouse name: Not on file  . Number of children: Not on file  . Years of education: Not on file  . Highest  education level: Not on file  Social Needs  . Financial resource strain: Not on file  . Food insecurity - worry: Not on file  . Food insecurity - inability: Not on file  . Transportation needs - medical: Not on file  . Transportation needs - non-medical: Not on file  Occupational History  . Not on file  Tobacco Use  . Smoking status: Former Smoker    Packs/day: 1.00    Types: Cigarettes    Last attempt to quit: 08/15/2016    Years since quitting: 0.3  . Smokeless tobacco: Former Network engineer and Sexual Activity  . Alcohol use: Yes    Alcohol/week: 3.6 oz    Types: 6 Cans of beer per week  . Drug use: No  . Sexual activity: Not on file  Other Topics Concern  . Not on file  Social History Narrative  . Not on file    ROS  General:  Negative for nexplained weight loss, fever Skin: Negative for new or changing mole, sore that won't heal HEENT: Negative for trouble hearing, trouble seeing, ringing in ears, mouth sores, hoarseness, change in voice, dysphagia. CV:  Negative for chest pain, dyspnea, edema, palpitations Resp: Negative for cough, dyspnea, hemoptysis GI: Negative for nausea, vomiting, diarrhea, constipation, abdominal pain, melena, hematochezia. GU: Negative for dysuria, incontinence, urinary hesitance, hematuria, vaginal or penile discharge, polyuria, sexual difficulty, lumps in testicle or breasts MSK: Negative for muscle cramps or aches, joint pain or swelling Neuro: Negative  for headaches, weakness, numbness, dizziness, passing out/fainting Psych: Negative for depression, anxiety, memory problems  Objective  Physical Exam Vitals:   12/28/16 1536  BP: 128/82  Pulse: 92  Temp: 98.2 F (36.8 C)  SpO2: 99%    BP Readings from Last 3 Encounters:  12/28/16 128/82  11/01/16 132/84  08/10/16 110/90   Wt Readings from Last 3 Encounters:  12/28/16 172 lb (78 kg)  11/01/16 170 lb (77.1 kg)  08/10/16 170 lb 4 oz (77.2 kg)    Physical Exam    Constitutional: No distress.  HENT:  Head: Normocephalic and atraumatic.  Mouth/Throat: Oropharynx is clear and moist. No oropharyngeal exudate.  Eyes: Conjunctivae are normal. Pupils are equal, round, and reactive to light.  Neck: Neck supple.  Cardiovascular: Normal rate, regular rhythm and normal heart sounds.  Pulmonary/Chest: Effort normal and breath sounds normal.  Abdominal: Soft. Bowel sounds are normal. He exhibits no distension. There is no tenderness. There is no rebound and no guarding.  Musculoskeletal: He exhibits no edema.  Lymphadenopathy:    He has no cervical adenopathy.  Neurological: He is alert. Gait normal.  Skin: Skin is warm and dry. He is not diaphoretic.  Psychiatric: Mood and affect normal.     Assessment/Plan:   Encounter for general adult medical examination with abnormal findings Physical exam completed.  Lab work will be completed at a future date.  He will work on diet and exercise.  Congratulated on smoking cessation.  ADHD Patient completed evaluation for this.  He is interested in starting on medication.  Will start on Vyvanse.  He will call us in several weeks to let us know how this is going.  If he has palpitations or appetite suppression he will let us know immediately.  Hypersomnia Patient with some hypersomnia associated with snoring and possible apneic episodes.  I offered a sleep study and recommended this though he declined and opted to monitor.  Anxiety and depression Improved.  Celexa can cause attention issues though his attention issues predated starting the Celexa.  We discussed continuing this for now given improvement.   Orders Placed This Encounter  Procedures  . Lipid panel    Standing Status:   Future    Standing Expiration Date:   12/28/2017  . Comp Met (CMET)    Standing Status:   Future    Standing Expiration Date:   12/28/2017    Meds ordered this encounter  Medications  . lisdexamfetamine (VYVANSE) 30 MG capsule     Sig: Take 1 capsule (30 mg total) by mouth daily.    Dispense:  30 capsule    Refill:  0     Tommi Rumps, MD [place]

## 2017-01-22 ENCOUNTER — Telehealth: Payer: Self-pay | Admitting: Family Medicine

## 2017-01-22 NOTE — Telephone Encounter (Signed)
Requesting dosage of Vyvanse be increased.  LOV 12/28/16 with Dr. Birdie SonsSonnenberg

## 2017-01-22 NOTE — Telephone Encounter (Signed)
Please advise 

## 2017-01-22 NOTE — Telephone Encounter (Signed)
Copied from CRM [phone]. Topic: Quick Communication - See Telephone Encounter >> Jan 22, 2017  1:27 PM Guinevere FerrariMorris, Tynika Luddy E, NT wrote: CRM for notification. See Telephone encounter for: Pt is calling to see if the doctor can up his dosage for medication lisdexamfetamine (VYVANSE) 30 MG capsule. Pt uses  [id number] [address], [place] - [id number] 01/22/17.

## 2017-01-22 NOTE — Telephone Encounter (Signed)
Patient needs an office visit to determine if we can go up on the medication.  Blood pressure and pulse will need to be checked and we will need to discuss medication changes in person.  I could see him on Monday at 345.  Thanks.

## 2017-01-23 NOTE — Telephone Encounter (Signed)
Patient is scheduled   

## 2017-01-23 NOTE — Telephone Encounter (Signed)
Left message to schedule patient for medication increase on Monday at 345, ok for pec to speak to patient and schedule

## 2017-01-23 NOTE — Telephone Encounter (Signed)
Will do a CRM to make agents aware for scheduling.

## 2017-01-28 ENCOUNTER — Encounter: Payer: Self-pay | Admitting: Family Medicine

## 2017-01-28 ENCOUNTER — Ambulatory Visit: Payer: Managed Care, Other (non HMO) | Admitting: Family Medicine

## 2017-01-28 VITALS — BP 116/86 | HR 74 | Temp 98.6°F | Wt 169.4 lb

## 2017-01-28 DIAGNOSIS — Z3009 Encounter for other general counseling and advice on contraception: Secondary | ICD-10-CM | POA: Diagnosis not present

## 2017-01-28 DIAGNOSIS — F909 Attention-deficit hyperactivity disorder, unspecified type: Secondary | ICD-10-CM | POA: Diagnosis not present

## 2017-01-28 DIAGNOSIS — F419 Anxiety disorder, unspecified: Secondary | ICD-10-CM | POA: Diagnosis not present

## 2017-01-28 DIAGNOSIS — Z13220 Encounter for screening for lipoid disorders: Secondary | ICD-10-CM | POA: Insufficient documentation

## 2017-01-28 DIAGNOSIS — F329 Major depressive disorder, single episode, unspecified: Secondary | ICD-10-CM | POA: Diagnosis not present

## 2017-01-28 DIAGNOSIS — F32A Depression, unspecified: Secondary | ICD-10-CM

## 2017-01-28 MED ORDER — LISDEXAMFETAMINE DIMESYLATE 60 MG PO CAPS: 60.0000 mg | ORAL_CAPSULE | ORAL | 0 refills | Status: DC

## 2017-01-28 MED ORDER — LISDEXAMFETAMINE DIMESYLATE 60 MG PO CAPS: 60.0000 mg | ORAL_CAPSULE | Freq: Every day | ORAL | 0 refills | Status: DC

## 2017-01-28 NOTE — Assessment & Plan Note (Signed)
Asymptomatic.  He will continue on Celexa.

## 2017-01-28 NOTE — Patient Instructions (Signed)
Nice to see you. I have refilled your Vyvanse.  If you have any sleep issues, appetite suppression, or you notice your heart rate and uses please let us know. I have referred you to urology to consider vasectomy.

## 2017-01-28 NOTE — Progress Notes (Signed)
Steve AlarEric Timmie Dugue, MD Phone: [phone]  Steve LeechJonathan Benjamin is a 27 y.o. male who presents today for follow-up.  ADHD: Taking Vyvanse.  He was started on 30 mg about a month ago.  He notes it was not terribly beneficial.  He had some benefit though not as much as he hoped.  He increase the dose to 60 mg daily for the last 5 days on his own.  He has had no adverse effects from this.  He notes no appetite suppression.  No sleep changes.  No palpitations.  He was previously on 60 mg when he was taking this before.  He notes his anxiety is well controlled.  He continues on Celexa.  Note depression.  He wonders about getting referred for a vasectomy.  He and his wife have 1 child and they do not plan to have other children.  He would like to be referred to urology.  Social History   Tobacco Use  Smoking Status Former Smoker  . Packs/day: 1.00  . Types: Cigarettes  . Last attempt to quit: 08/15/2016  . Years since quitting: 0.4  Smokeless Tobacco Former User     ROS see history of present illness  Objective  Physical Exam Vitals:   01/28/17 1544  BP: 116/86  Pulse: 74  Temp: 98.6 F (37 C)  SpO2: 98%    BP Readings from Last 3 Encounters:  01/28/17 116/86  12/28/16 128/82  11/01/16 132/84   Wt Readings from Last 3 Encounters:  01/28/17 169 lb 6.4 oz (76.8 kg)  12/28/16 172 lb (78 kg)  11/01/16 170 lb (77.1 kg)    Physical Exam  Constitutional: No distress.  Cardiovascular: Normal rate, regular rhythm and normal heart sounds.  Pulmonary/Chest: Effort normal and breath sounds normal.  Neurological: He is alert. Gait normal.  Skin: Skin is warm and dry. He is not diaphoretic.     Assessment/Plan: Please see individual problem list.  ADHD Improved control after he increased the dose on his own.  He has had no adverse effects and thus myself and our clinical pharmacist feel it is reasonable to keep him at the 60 mg dose.  Refills were given.  If he notices any adverse  effects he will let us know.  Follow-up in 3 months.  Anxiety and depression Asymptomatic.  He will continue on Celexa.  Vasectomy evaluation We will refer to urology to discuss possibility of vasectomy.   Orders Placed This Encounter  Procedures  . Ambulatory referral to Urology    Referral Priority:   Routine    Referral Type:   Consultation    Referral Reason:   Specialty Services Required    Requested Specialty:   Urology    Number of Visits Requested:   1    Meds ordered this encounter  Medications  . lisdexamfetamine (VYVANSE) 60 MG capsule    Sig: Take 1 capsule (60 mg total) by mouth daily.    Dispense:  30 capsule    Refill:  0  . lisdexamfetamine (VYVANSE) 60 MG capsule    Sig: Take 1 capsule (60 mg total) by mouth every morning. Do not fill until 02/28/17    Dispense:  30 capsule    Refill:  0  . lisdexamfetamine (VYVANSE) 60 MG capsule    Sig: Take 1 capsule (60 mg total) by mouth every morning. Do not fill until 03/28/17    Dispense:  30 capsule    Refill:  0     Steve AlarEric Zach Tietje, MD [facility]  [place]

## 2017-01-28 NOTE — Assessment & Plan Note (Signed)
Improved control after he increased the dose on his own.  He has had no adverse effects and thus myself and our clinical pharmacist feel it is reasonable to keep him at the 60 mg dose.  Refills were given.  If he notices any adverse effects he will let us know.  Follow-up in 3 months.

## 2017-01-28 NOTE — Assessment & Plan Note (Signed)
We will refer to urology to discuss possibility of vasectomy.

## 2017-02-14 ENCOUNTER — Ambulatory Visit: Payer: Self-pay | Admitting: Urology

## 2017-02-25 ENCOUNTER — Ambulatory Visit: Payer: Managed Care, Other (non HMO) | Admitting: Urology

## 2017-03-19 ENCOUNTER — Encounter: Payer: Self-pay | Admitting: Urology

## 2017-03-19 ENCOUNTER — Ambulatory Visit: Payer: Managed Care, Other (non HMO) | Admitting: Urology

## 2017-05-06 ENCOUNTER — Other Ambulatory Visit: Payer: Self-pay | Admitting: Family Medicine

## 2017-05-07 NOTE — Telephone Encounter (Signed)
He needs follow-up in the office to receive a refill on this medication. He needs to follow-up in the office every 3 months for refills of this medication. Please contact him to get this scheduled. Thanks.

## 2017-05-07 NOTE — Telephone Encounter (Signed)
Refilled: 01/28/2017 Last OV: 01/28/2017 Next OV: not scheduled

## 2017-05-10 NOTE — Telephone Encounter (Signed)
LMTCB. Need to schedule pt a follow up appt with Dr. Birdie SonsSonnenberg in order for pt to get any more refills on medication. PEC may speak with pt.

## 2017-05-16 MED ORDER — LISDEXAMFETAMINE DIMESYLATE 60 MG PO CAPS: 60.0000 mg | ORAL_CAPSULE | Freq: Every day | ORAL | 0 refills | Status: DC

## 2017-05-16 NOTE — Telephone Encounter (Signed)
Please advise patient scheduled for 06-11-17.

## 2017-05-16 NOTE — Telephone Encounter (Signed)
Pt called about refill. I relayed the message from Dr. Birdie Sons and pt has made an appt on 06/11/17. He is hoping for a refill until that appt.

## 2017-05-16 NOTE — Telephone Encounter (Signed)
This was inadvertently printed. Please shred the printed version. I sent this electronically to the pharmacy.

## 2017-06-11 ENCOUNTER — Ambulatory Visit: Payer: Self-pay | Admitting: Family Medicine

## 2019-04-08 ENCOUNTER — Other Ambulatory Visit: Payer: Self-pay

## 2019-04-08 ENCOUNTER — Ambulatory Visit: Payer: BC Managed Care – PPO | Admitting: Family Medicine

## 2019-04-08 ENCOUNTER — Encounter: Payer: Self-pay | Admitting: Family Medicine

## 2019-04-08 DIAGNOSIS — L731 Pseudofolliculitis barbae: Secondary | ICD-10-CM | POA: Diagnosis not present

## 2019-04-08 DIAGNOSIS — F325 Major depressive disorder, single episode, in full remission: Secondary | ICD-10-CM | POA: Diagnosis not present

## 2019-04-08 DIAGNOSIS — F909 Attention-deficit hyperactivity disorder, unspecified type: Secondary | ICD-10-CM

## 2019-04-08 MED ORDER — LISDEXAMFETAMINE DIMESYLATE 30 MG PO CAPS: 30.0000 mg | ORAL_CAPSULE | ORAL | 0 refills | Status: DC

## 2019-04-08 NOTE — Assessment & Plan Note (Signed)
Patient will continue his current regimen.  He will let us know when he needs a refill.  Follow-up in 3 months.

## 2019-04-08 NOTE — Assessment & Plan Note (Signed)
Resolved.  He will monitor for recurrence.  Discussed warm compresses if it does recur.  Advised on reasons to contact us regarding these types of lesions in the future.

## 2019-04-08 NOTE — Progress Notes (Signed)
  Marikay Alar, MD Phone: [phone]  Steve Benjamin is a 29 y.o. male who presents today for f/u.  ADHD Medication: vyvanse 30 mg once daily, adderall 20 mg once daily in the afternoon Effectiveness: yes Palpitations: no Sleep difficulty: no Appetite suppression: no  Depression: Patient notes no depressive symptoms currently.  Has been on Wellbutrin for about a year.  He notes has been very helpful.  No SI.  Ingrown hair: Patient notes this was in the suprapubic area.  It has improved on its own.  It drained purulent material.  No fevers.  Social History   Tobacco Use  Smoking Status Former Smoker  . Packs/day: 1.00  . Types: Cigarettes  . Quit date: 08/15/2016  . Years since quitting: 2.6  Smokeless Tobacco Former User     ROS see history of present illness  Objective  Physical Exam Vitals:   04/08/19 0841  BP: 120/80  Pulse: (!) 57  Temp: (!) 96.5 F (35.8 C)  SpO2: 99%    BP Readings from Last 3 Encounters:  04/08/19 120/80  01/28/17 116/86  12/28/16 128/82   Wt Readings from Last 3 Encounters:  04/08/19 153 lb (69.4 kg)  01/28/17 169 lb 6.4 oz (76.8 kg)  12/28/16 172 lb (78 kg)    Physical Exam Constitutional:      General: He is not in acute distress.    Appearance: He is not diaphoretic.  Cardiovascular:     Rate and Rhythm: Normal rate and regular rhythm.     Heart sounds: Normal heart sounds.  Pulmonary:     Effort: Pulmonary effort is normal.     Breath sounds: Normal breath sounds.  Genitourinary:   Skin:    General: Skin is warm and dry.  Neurological:     Mental Status: He is alert.      Assessment/Plan: Please see individual problem list.  ADHD Patient will continue his current regimen.  He will let us know when he needs a refill.  Follow-up in 3 months.  Depression Much improved.  He will continue Wellbutrin for now.  Discussed the potential for tapering off in the future.  Ingrown hair Resolved.  He will monitor  for recurrence.  Discussed warm compresses if it does recur.  Advised on reasons to contact us regarding these types of lesions in the future.   No orders of the defined types were placed in this encounter.   Meds ordered this encounter  Medications  . lisdexamfetamine (VYVANSE) 30 MG capsule    Sig: Take 1 capsule (30 mg total) by mouth every morning.    Dispense:  30 capsule    Refill:  0    This visit occurred during the SARS-CoV-2 public health emergency.  Safety protocols were in place, including screening questions prior to the visit, additional usage of staff PPE, and extensive cleaning of exam room while observing appropriate contact time as indicated for disinfecting solutions.    Marikay Alar, MD [facility]

## 2019-04-08 NOTE — Assessment & Plan Note (Signed)
Much improved.  He will continue Wellbutrin for now.  Discussed the potential for tapering off in the future.

## 2019-04-08 NOTE — Patient Instructions (Signed)
Nice to see you. We will see back in 3 months.

## 2019-05-05 ENCOUNTER — Telehealth (INDEPENDENT_AMBULATORY_CARE_PROVIDER_SITE_OTHER): Payer: BC Managed Care – PPO | Admitting: Family Medicine

## 2019-05-05 ENCOUNTER — Other Ambulatory Visit: Payer: Self-pay

## 2019-05-05 ENCOUNTER — Encounter: Payer: Self-pay | Admitting: Family Medicine

## 2019-05-05 DIAGNOSIS — J309 Allergic rhinitis, unspecified: Secondary | ICD-10-CM

## 2019-05-05 MED ORDER — FLUTICASONE PROPIONATE 50 MCG/ACT NA SUSP: 2.0000 | Freq: Every day | NASAL | 6 refills | Status: DC

## 2019-05-05 MED ORDER — FEXOFENADINE HCL 180 MG PO TABS: 180.0000 mg | ORAL_TABLET | Freq: Every day | ORAL | 1 refills | Status: DC

## 2019-05-05 NOTE — Assessment & Plan Note (Signed)
Symptoms are consistent with the patient's yearly allergies.  Will prescribe Flonase and Allegra.  Discussed if this is not beneficial he should let us know.  Advised if he developed any shortness of breath, taste or smell disturbances, fever, or any other symptoms he should get looked at again as those may not be related to allergies.

## 2019-05-05 NOTE — Progress Notes (Signed)
Virtual Visit via video Note  This visit type was conducted due to national recommendations for restrictions regarding the COVID-19 pandemic (e.g. social distancing).  This format is felt to be most appropriate for this patient at this time.  All issues noted in this document were discussed and addressed.  No physical exam was performed (except for noted visual exam findings with Video Visits).   I connected with Steve Benjamin today at 10:30 AM EDT by a video enabled telemedicine application and verified that I am speaking with the correct person using two identifiers. Location patient: home Location provider: work  Persons participating in the virtual visit: patient, provider  I discussed the limitations, risks, security and privacy concerns of performing an evaluation and management service by telephone and the availability of in person appointments. I also discussed with the patient that there may be a patient responsible charge related to this service. The patient expressed understanding and agreed to proceed.   Reason for visit: same day visit  HPI: Allergic rhinitis: Patient notes typically getting allergy symptoms this time of year.  He has itchy eyes and a little stuffiness as well as sneezing and postnasal drip.  Minimal headache.  No cough, shortness of breath, taste or smell disturbances, or fevers.  No COVID-19 exposure.  He has not gotten his Covid vaccine.  Has been taking Allegra-D and Flonase and that has been working well.  He would like prescriptions for those.   ROS: See pertinent positives and negatives per HPI.  Past Medical History:  Diagnosis Date  . Depression   . Headache     Past Surgical History:  Procedure Laterality Date  . NO PAST SURGERIES      Family History  Problem Relation Age of Onset  . Arthritis Paternal Uncle   . Hyperlipidemia Maternal Grandfather   . Heart disease Maternal Grandfather   . Cancer Paternal Grandfather   . Heart  disease Paternal Grandfather   . Alzheimer's disease Paternal Grandfather     SOCIAL HX: Former smoker   Current Outpatient Medications:  .  amphetamine-dextroamphetamine (ADDERALL) 20 MG tablet, Take 20 mg by mouth daily., Disp: , Rfl:  .  buPROPion (WELLBUTRIN XL) 150 MG 24 hr tablet, Take 150 mg by mouth daily., Disp: , Rfl:  .  lisdexamfetamine (VYVANSE) 30 MG capsule, Take 1 capsule (30 mg total) by mouth every morning., Disp: 30 capsule, Rfl: 0 .  fexofenadine (ALLEGRA ALLERGY) 180 MG tablet, Take 1 tablet (180 mg total) by mouth daily., Disp: 90 tablet, Rfl: 1 .  fluticasone (FLONASE) 50 MCG/ACT nasal spray, Place 2 sprays into both nostrils daily., Disp: 16 g, Rfl: 6  EXAM:  VITALS per patient if applicable:  GENERAL: alert, oriented, appears well and in no acute distress  HEENT: atraumatic, conjunttiva clear, no obvious abnormalities on inspection of external nose and ears  NECK: normal movements of the head and neck  LUNGS: on inspection no signs of respiratory distress, breathing rate appears normal, no obvious gross SOB, gasping or wheezing  CV: no obvious cyanosis  MS: moves all visible extremities without noticeable abnormality  PSYCH/NEURO: pleasant and cooperative, no obvious depression or anxiety, speech and thought processing grossly intact  ASSESSMENT AND PLAN:  Discussed the following assessment and plan:  Allergic rhinitis Symptoms are consistent with the patient's yearly allergies.  Will prescribe Flonase and Allegra.  Discussed if this is not beneficial he should let us know.  Advised if he developed any shortness of breath, taste or  smell disturbances, fever, or any other symptoms he should get looked at again as those may not be related to allergies.   No orders of the defined types were placed in this encounter.   Meds ordered this encounter  Medications  . fluticasone (FLONASE) 50 MCG/ACT nasal spray    Sig: Place 2 sprays into both nostrils  daily.    Dispense:  16 g    Refill:  6  . fexofenadine (ALLEGRA ALLERGY) 180 MG tablet    Sig: Take 1 tablet (180 mg total) by mouth daily.    Dispense:  90 tablet    Refill:  1     I discussed the assessment and treatment plan with the patient. The patient was provided an opportunity to ask questions and all were answered. The patient agreed with the plan and demonstrated an understanding of the instructions.   The patient was advised to call back or seek an in-person evaluation if the symptoms worsen or if the condition fails to improve as anticipated.   Marikay Alar, MD

## 2019-06-17 ENCOUNTER — Encounter: Payer: Self-pay | Admitting: Family Medicine

## 2019-06-17 ENCOUNTER — Other Ambulatory Visit: Payer: Self-pay

## 2019-06-17 ENCOUNTER — Telehealth (INDEPENDENT_AMBULATORY_CARE_PROVIDER_SITE_OTHER): Payer: 59 | Admitting: Family Medicine

## 2019-06-17 DIAGNOSIS — J309 Allergic rhinitis, unspecified: Secondary | ICD-10-CM

## 2019-06-17 DIAGNOSIS — F909 Attention-deficit hyperactivity disorder, unspecified type: Secondary | ICD-10-CM

## 2019-06-17 NOTE — Assessment & Plan Note (Addendum)
Chronic issue.  Will check with our clinical pharmacist regarding twice daily dosing of Vyvanse.  Would consider increasing the dose if twice daily dosing is not acceptable.  He will discontinue the Adderall.

## 2019-06-17 NOTE — Assessment & Plan Note (Addendum)
Chronic issue.  Currently asymptomatic.  He will monitor for recurrence and restart medications if needed.

## 2019-06-17 NOTE — Progress Notes (Signed)
Virtual Visit via video Note  This visit type was conducted due to national recommendations for restrictions regarding the COVID-19 pandemic (e.g. social distancing).  This format is felt to be most appropriate for this patient at this time.  All issues noted in this document were discussed and addressed.  No physical exam was performed (except for noted visual exam findings with Video Visits).   I connected with Steve Benjamin today at  1:45 PM EDT by a video enabled telemedicine application and verified that I am speaking with the correct person using two identifiers. Location patient: work Location provider: work  Persons participating in the virtual visit: patient, provider  I discussed the limitations, risks, security and privacy concerns of performing an evaluation and management service by telephone and the availability of in person appointments. I also discussed with the patient that there may be a patient responsible charge related to this service. The patient expressed understanding and agreed to proceed.  Reason for visit: f/u  HPI: ADHD Medication: Vyvanse in the morning and Adderall in the afternoon. Effectiveness: Yes though he notes the Adderall makes him feel like he is on something.  He ends up crashing typically after taking it.  He notes Ritalin was similar in the past.  He has tried taking Vyvanse in the morning and afternoon and notes that has worked well for him. Palpitations: No Sleep difficulty:.  No. Appetite suppression: No.  Allergic rhinitis: Patient notes these symptoms have improved.  He is no longer on the Allegra or Flonase.    ROS: See pertinent positives and negatives per HPI.  Past Medical History:  Diagnosis Date  . Depression   . Headache     Past Surgical History:  Procedure Laterality Date  . NO PAST SURGERIES      Family History  Problem Relation Age of Onset  . Arthritis Paternal Uncle   . Hyperlipidemia Maternal Grandfather   .  Heart disease Maternal Grandfather   . Cancer Paternal Grandfather   . Heart disease Paternal Grandfather   . Alzheimer's disease Paternal Grandfather     SOCIAL HX: Former smoker   Current Outpatient Medications:  .  amphetamine-dextroamphetamine (ADDERALL) 20 MG tablet, Take 20 mg by mouth daily., Disp: , Rfl:  .  buPROPion (WELLBUTRIN XL) 150 MG 24 hr tablet, Take 150 mg by mouth daily., Disp: , Rfl:  .  fexofenadine (ALLEGRA ALLERGY) 180 MG tablet, Take 1 tablet (180 mg total) by mouth daily., Disp: 90 tablet, Rfl: 1 .  fluticasone (FLONASE) 50 MCG/ACT nasal spray, Place 2 sprays into both nostrils daily., Disp: 16 g, Rfl: 6 .  lisdexamfetamine (VYVANSE) 30 MG capsule, Take 1 capsule (30 mg total) by mouth every morning., Disp: 30 capsule, Rfl: 0  EXAM:  VITALS per patient if applicable:  GENERAL: alert, oriented, appears well and in no acute distress  HEENT: atraumatic, conjunttiva clear, no obvious abnormalities on inspection of external nose and ears  NECK: normal movements of the head and neck  LUNGS: on inspection no signs of respiratory distress, breathing rate appears normal, no obvious gross SOB, gasping or wheezing  CV: no obvious cyanosis  MS: moves all visible extremities without noticeable abnormality  PSYCH/NEURO: pleasant and cooperative, no obvious depression or anxiety, speech and thought processing grossly intact  ASSESSMENT AND PLAN:  Discussed the following assessment and plan:  ADHD Chronic issue.  Will check with our clinical pharmacist regarding twice daily dosing of Vyvanse.  Would consider increasing the dose if twice  daily dosing is not acceptable.  He will discontinue the Adderall.  Allergic rhinitis Chronic issue.  Currently asymptomatic.  He will monitor for recurrence and restart medications if needed.   No orders of the defined types were placed in this encounter.   No orders of the defined types were placed in this encounter.    I  discussed the assessment and treatment plan with the patient. The patient was provided an opportunity to ask questions and all were answered. The patient agreed with the plan and demonstrated an understanding of the instructions.   The patient was advised to call back or seek an in-person evaluation if the symptoms worsen or if the condition fails to improve as anticipated.    Tommi Rumps, MD

## 2019-06-18 ENCOUNTER — Telehealth: Payer: Self-pay | Admitting: Family Medicine

## 2019-06-18 MED ORDER — LISDEXAMFETAMINE DIMESYLATE 40 MG PO CAPS: 40.0000 mg | ORAL_CAPSULE | ORAL | 0 refills | Status: DC

## 2019-06-18 NOTE — Telephone Encounter (Signed)
I called and spoke with the patient and informed him of the message from the provider about the vyvanse and the patient is ok with the increase of the dosage, but he would like a 30 day supply instead of a 90 day supply of that does change.  Steve Benjamin,cma

## 2019-06-18 NOTE — Addendum Note (Signed)
Addended by: Glori Luis on: 06/18/2019 10:31 AM   Modules accepted: Orders

## 2019-06-18 NOTE — Telephone Encounter (Signed)
Please let the patient know I heard back from the clinical pharmacist regarding his vyvanse. She did not think that his insurance would pay for more than one tablet per day and felt as though the twice daily dosing would likely interfere with his sleep given how long it is in his system. I would suggest we try a higher dose of the vyvanse and see if that eliminates the need for the afternoon adderall. I can send this in once you speak with him.

## 2019-06-18 NOTE — Telephone Encounter (Signed)
-----   Message from Lourena Simmonds, [facility] sent at 06/17/2019  4:45 PM EDT ----- Steve Benjamin,   Did taking a second dose disturb his sleep? Appears duration of 8-14 hours, so I would want him to lean more towards taking 8-10 hours after first, but I would still think it could keep him up at night.   I will say, insurance may not pay for more than 1 capsule per day. ----- Message ----- From: Glori Luis, MD Sent: 06/17/2019   2:01 PM EDT To: Lourena Simmonds, [facility]  Hey Catie,   This patient has ADHD and has been on vyvanse and adderall. He notes the adderall makes him feel like he is on something and ritalin was similar. He recently tried taking his vyvanse 30 mg twice daily and noted that worked well for him. I know vyvanse is a once daily medication, but wanted to see what your thoughts on taking this twice daily would be. Could we not dose this twice a day as long as the total daily dose was less than the max dose and he was not having any side effects? Thanks for your thoughts.  Steve Benjamin

## 2019-06-18 NOTE — Telephone Encounter (Signed)
New dose sent to pharmacy. Patient should have follow-up in about a month to see how this is working for him.

## 2019-06-19 NOTE — Telephone Encounter (Signed)
I called to schedule the patient for a 1 month f/up with the provider but the mailbox was full and I could not leave a message.  Jozee Hammer,cma

## 2019-06-23 NOTE — Telephone Encounter (Signed)
Patient is scheduled in 1 month to f/up on medication.  Steve Benjamin,cma

## 2019-07-09 ENCOUNTER — Ambulatory Visit: Payer: BC Managed Care – PPO | Admitting: Family Medicine

## 2019-07-10 ENCOUNTER — Ambulatory Visit: Payer: BC Managed Care – PPO | Admitting: Family Medicine

## 2019-07-22 ENCOUNTER — Ambulatory Visit: Payer: 59 | Admitting: Family Medicine

## 2019-07-24 ENCOUNTER — Telehealth: Payer: 59 | Admitting: Family Medicine

## 2019-07-28 ENCOUNTER — Telehealth: Payer: Self-pay | Admitting: Family Medicine

## 2019-07-28 ENCOUNTER — Other Ambulatory Visit: Payer: Self-pay | Admitting: Family Medicine

## 2019-07-28 NOTE — Telephone Encounter (Signed)
Pt wanted to increase the lisdexamfetamine (VYVANSE) 40 MG capsule and new rx called in. Dinna Severs,cma

## 2019-07-28 NOTE — Telephone Encounter (Signed)
Pt wanted to increase the lisdexamfetamine (VYVANSE) 40 MG capsule and new rx called in

## 2019-07-29 MED ORDER — LISDEXAMFETAMINE DIMESYLATE 40 MG PO CAPS: 40.0000 mg | ORAL_CAPSULE | ORAL | 0 refills | Status: DC

## 2019-08-07 ENCOUNTER — Encounter: Payer: Self-pay | Admitting: Family Medicine

## 2019-08-07 ENCOUNTER — Other Ambulatory Visit: Payer: Self-pay

## 2019-08-07 ENCOUNTER — Telehealth (INDEPENDENT_AMBULATORY_CARE_PROVIDER_SITE_OTHER): Payer: 59 | Admitting: Family Medicine

## 2019-08-07 DIAGNOSIS — F325 Major depressive disorder, single episode, in full remission: Secondary | ICD-10-CM | POA: Diagnosis not present

## 2019-08-07 DIAGNOSIS — F909 Attention-deficit hyperactivity disorder, unspecified type: Secondary | ICD-10-CM | POA: Diagnosis not present

## 2019-08-07 MED ORDER — METHYLPHENIDATE HCL 5 MG PO TABS: 5.0000 mg | ORAL_TABLET | Freq: Every day | ORAL | 0 refills | Status: DC

## 2019-08-07 MED ORDER — BUPROPION HCL ER (XL) 150 MG PO TB24: 150.0000 mg | ORAL_TABLET | Freq: Every day | ORAL | 1 refills | Status: DC

## 2019-08-07 NOTE — Assessment & Plan Note (Signed)
Continues to have issues later in the day.  He did not tolerate Adderall due to side effects in the afternoon.  We will try adding on a low-dose of Ritalin to take in the afternoon and he will continue on his Vyvanse.

## 2019-08-07 NOTE — Progress Notes (Signed)
Virtual Visit via video Note  This visit type was conducted due to national recommendations for restrictions regarding the COVID-19 pandemic (e.g. social distancing).  This format is felt to be most appropriate for this patient at this time.  All issues noted in this document were discussed and addressed.  No physical exam was performed (except for noted visual exam findings with Video Visits).   I connected with Steve Benjamin today at  4:00 PM EDT by a video enabled telemedicine application and verified that I am speaking with the correct person using two identifiers. Location patient: work Location provider: work  Persons participating in the virtual visit: patient, provider  I discussed the limitations, risks, security and privacy concerns of performing an evaluation and management service by telephone and the availability of in person appointments. I also discussed with the patient that there may be a patient responsible charge related to this service. The patient expressed understanding and agreed to proceed.   Reason for visit: f/u  HPI: ADHD Medication: vyvanse 40 mg Effectiveness: yes in the morning though after lunch not effective, adderall in the afternoon previously gave him a headache and side effects Palpitations: no Sleep difficulty: no Appetite suppression: no  Depression: well controlled. No SI. On wellbutrin.     ROS: See pertinent positives and negatives per HPI.  Past Medical History:  Diagnosis Date  . Depression   . Headache     Past Surgical History:  Procedure Laterality Date  . NO PAST SURGERIES      Family History  Problem Relation Age of Onset  . Arthritis Paternal Uncle   . Hyperlipidemia Maternal Grandfather   . Heart disease Maternal Grandfather   . Cancer Paternal Grandfather   . Heart disease Paternal Grandfather   . Alzheimer's disease Paternal Grandfather     SOCIAL HX: former smoker   Current Outpatient Medications:  .   buPROPion (WELLBUTRIN XL) 150 MG 24 hr tablet, Take 1 tablet (150 mg total) by mouth daily., Disp: 90 tablet, Rfl: 1 .  lisdexamfetamine (VYVANSE) 40 MG capsule, Take 1 capsule (40 mg total) by mouth every morning., Disp: 30 capsule, Rfl: 0 .  methylphenidate (RITALIN) 5 MG tablet, Take 1 tablet (5 mg total) by mouth daily in the afternoon., Disp: 30 tablet, Rfl: 0  EXAM:  VITALS per patient if applicable:  GENERAL: alert, oriented, appears well and in no acute distress  HEENT: atraumatic, conjunttiva clear, no obvious abnormalities on inspection of external nose and ears  NECK: normal movements of the head and neck  LUNGS: on inspection no signs of respiratory distress, breathing rate appears normal, no obvious gross SOB, gasping or wheezing  CV: no obvious cyanosis  MS: moves all visible extremities without noticeable abnormality  PSYCH/NEURO: pleasant and cooperative, no obvious depression or anxiety, speech and thought processing grossly intact  ASSESSMENT AND PLAN:  Discussed the following assessment and plan:  Depression Well-controlled.  Continue Wellbutrin.  ADHD Continues to have issues later in the day.  He did not tolerate Adderall due to side effects in the afternoon.  We will try adding on a low-dose of Ritalin to take in the afternoon and he will continue on his Vyvanse.   No orders of the defined types were placed in this encounter.   Meds ordered this encounter  Medications  . buPROPion (WELLBUTRIN XL) 150 MG 24 hr tablet    Sig: Take 1 tablet (150 mg total) by mouth daily.    Dispense:  90 tablet  Refill:  1  . methylphenidate (RITALIN) 5 MG tablet    Sig: Take 1 tablet (5 mg total) by mouth daily in the afternoon.    Dispense:  30 tablet    Refill:  0     I discussed the assessment and treatment plan with the patient. The patient was provided an opportunity to ask questions and all were answered. The patient agreed with the plan and demonstrated an  understanding of the instructions.   The patient was advised to call back or seek an in-person evaluation if the symptoms worsen or if the condition fails to improve as anticipated.  Marikay Alar, MD

## 2019-08-07 NOTE — Assessment & Plan Note (Signed)
Well-controlled.  Continue Wellbutrin.

## 2019-08-13 ENCOUNTER — Encounter: Payer: Self-pay | Admitting: Family Medicine

## 2019-09-07 ENCOUNTER — Telehealth: Payer: Self-pay | Admitting: Family Medicine

## 2019-09-07 MED ORDER — LISDEXAMFETAMINE DIMESYLATE 40 MG PO CAPS: 40.0000 mg | ORAL_CAPSULE | ORAL | 0 refills | Status: DC

## 2019-09-07 NOTE — Telephone Encounter (Signed)
Sent to pharmacy 

## 2019-09-07 NOTE — Telephone Encounter (Signed)
Patient called in need refill on lisdexamfetamine (VYVANSE) 40 MG capsule

## 2019-09-18 ENCOUNTER — Ambulatory Visit: Payer: 59 | Admitting: Family Medicine

## 2019-09-29 ENCOUNTER — Telehealth (INDEPENDENT_AMBULATORY_CARE_PROVIDER_SITE_OTHER): Payer: 59 | Admitting: Family Medicine

## 2019-09-29 ENCOUNTER — Other Ambulatory Visit: Payer: Self-pay

## 2019-09-29 ENCOUNTER — Encounter: Payer: Self-pay | Admitting: Family Medicine

## 2019-09-29 DIAGNOSIS — F909 Attention-deficit hyperactivity disorder, unspecified type: Secondary | ICD-10-CM | POA: Diagnosis not present

## 2019-09-29 DIAGNOSIS — M79673 Pain in unspecified foot: Secondary | ICD-10-CM | POA: Insufficient documentation

## 2019-09-29 DIAGNOSIS — M79672 Pain in left foot: Secondary | ICD-10-CM

## 2019-09-29 HISTORY — DX: Pain in unspecified foot: M79.673

## 2019-09-29 MED ORDER — METHYLPHENIDATE HCL 5 MG PO TABS: 5.0000 mg | ORAL_TABLET | Freq: Every day | ORAL | 0 refills | Status: DC

## 2019-09-29 MED ORDER — LISDEXAMFETAMINE DIMESYLATE 40 MG PO CAPS: 40.0000 mg | ORAL_CAPSULE | ORAL | 0 refills | Status: DC

## 2019-09-29 NOTE — Progress Notes (Signed)
Virtual Visit via video Note  This visit type was conducted due to national recommendations for restrictions regarding the COVID-19 pandemic (e.g. social distancing).  This format is felt to be most appropriate for this patient at this time.  All issues noted in this document were discussed and addressed.  No physical exam was performed (except for noted visual exam findings with Video Visits).   I connected with Steve Benjamin today at 12:00 PM EDT by a video enabled telemedicine application and verified that I am speaking with the correct person using two identifiers. Location patient: work Location provider: work Persons participating in the virtual visit: patient, provider  I discussed the limitations, risks, security and privacy concerns of performing an evaluation and management service by telephone and the availability of in person appointments. I also discussed with the patient that there may be a patient responsible charge related to this service. The patient expressed understanding and agreed to proceed.  Reason for visit: Follow-up.  HPI: ADHD: Taking Vyvanse in the morning and Ritalin in the afternoon.  He notes no side effects from the Ritalin.  Notes this regimen has been beneficial.  No appetite suppression, sleep changes, or palpitations.  Left foot injury: Notes he was hiking a week ago and felt a pop in his back and then felt a pop in his lateral left foot.  Has been limping over the last week.  Notes it is progressively improving.  The discomfort is inferior to his lateral malleolus.  There is no tenderness of his malleolus.  He did not twist his ankle.  He has been taking some ibuprofen.  He had no issues walking after this injury.  He notes no issues with his back currently.   ROS: See pertinent positives and negatives per HPI.  Past Medical History:  Diagnosis Date   Depression    Headache     Past Surgical History:  Procedure Laterality Date   NO PAST  SURGERIES      Family History  Problem Relation Age of Onset   Arthritis Paternal Uncle    Hyperlipidemia Maternal Grandfather    Heart disease Maternal Grandfather    Cancer Paternal Grandfather    Heart disease Paternal Grandfather    Alzheimer's disease Paternal Grandfather     SOCIAL HX: Former smoker   Current Outpatient Medications:    buPROPion (WELLBUTRIN XL) 150 MG 24 hr tablet, Take 1 tablet (150 mg total) by mouth daily., Disp: 90 tablet, Rfl: 1   [START ON 11/07/2019] lisdexamfetamine (VYVANSE) 40 MG capsule, Take 1 capsule (40 mg total) by mouth every morning., Disp: 30 capsule, Rfl: 0   [START ON 11/07/2019] methylphenidate (RITALIN) 5 MG tablet, Take 1 tablet (5 mg total) by mouth daily in the afternoon., Disp: 30 tablet, Rfl: 0   [START ON 12/08/2019] lisdexamfetamine (VYVANSE) 40 MG capsule, Take 1 capsule (40 mg total) by mouth every morning., Disp: 30 capsule, Rfl: 0   [START ON 10/08/2019] lisdexamfetamine (VYVANSE) 40 MG capsule, Take 1 capsule (40 mg total) by mouth every morning., Disp: 30 capsule, Rfl: 0   [START ON 12/08/2019] methylphenidate (RITALIN) 5 MG tablet, Take 1 tablet (5 mg total) by mouth daily in the afternoon., Disp: 30 tablet, Rfl: 0   [START ON 10/08/2019] methylphenidate (RITALIN) 5 MG tablet, Take 1 tablet (5 mg total) by mouth daily in the afternoon., Disp: 30 tablet, Rfl: 0  EXAM:  VITALS per patient if applicable:  GENERAL: alert, oriented, appears well and in no acute  distress  HEENT: atraumatic, conjunttiva clear, no obvious abnormalities on inspection of external nose and ears  NECK: normal movements of the head and neck  LUNGS: on inspection no signs of respiratory distress, breathing rate appears normal, no obvious gross SOB, gasping or wheezing  CV: no obvious cyanosis  MS: moves all visible extremities without noticeable abnormality  PSYCH/NEURO: pleasant and cooperative, no obvious depression or anxiety, speech  and thought processing grossly intact  ASSESSMENT AND PLAN:  Discussed the following assessment and plan:  ADHD Adequately controlled.  He will continue Vyvanse 40 mg once daily and Ritalin 5 mg once daily in the afternoon.  Advised to monitor for sleep issues.  He will also monitor for appetite issues or palpitations.  He will try to check his blood pressure at home and send [place] his readings.  He will follow up in 3 months.  Controlled substance database reviewed.  Foot pain Suspect sprain.  This is progressively improving.  Discussed icing and over-the-counter ibuprofen.  If not improving over the next week to let us know for a referral.   No orders of the defined types were placed in this encounter.   Meds ordered this encounter  Medications   lisdexamfetamine (VYVANSE) 40 MG capsule    Sig: Take 1 capsule (40 mg total) by mouth every morning.    Dispense:  30 capsule    Refill:  0   lisdexamfetamine (VYVANSE) 40 MG capsule    Sig: Take 1 capsule (40 mg total) by mouth every morning.    Dispense:  30 capsule    Refill:  0   lisdexamfetamine (VYVANSE) 40 MG capsule    Sig: Take 1 capsule (40 mg total) by mouth every morning.    Dispense:  30 capsule    Refill:  0   methylphenidate (RITALIN) 5 MG tablet    Sig: Take 1 tablet (5 mg total) by mouth daily in the afternoon.    Dispense:  30 tablet    Refill:  0   methylphenidate (RITALIN) 5 MG tablet    Sig: Take 1 tablet (5 mg total) by mouth daily in the afternoon.    Dispense:  30 tablet    Refill:  0   methylphenidate (RITALIN) 5 MG tablet    Sig: Take 1 tablet (5 mg total) by mouth daily in the afternoon.    Dispense:  30 tablet    Refill:  0     I discussed the assessment and treatment plan with the patient. The patient was provided an opportunity to ask questions and all were answered. The patient agreed with the plan and demonstrated an understanding of the instructions.   The patient was advised to call back  or seek an in-person evaluation if the symptoms worsen or if the condition fails to improve as anticipated.  Marikay Alar, MD

## 2019-09-29 NOTE — Assessment & Plan Note (Signed)
Suspect sprain.  This is progressively improving.  Discussed icing and over-the-counter ibuprofen.  If not improving over the next week to let us know for a referral.

## 2019-09-29 NOTE — Assessment & Plan Note (Signed)
Adequately controlled.  He will continue Vyvanse 40 mg once daily and Ritalin 5 mg once daily in the afternoon.  Advised to monitor for sleep issues.  He will also monitor for appetite issues or palpitations.  He will try to check his blood pressure at home and send [place] his readings.  He will follow up in 3 months.  Controlled substance database reviewed.

## 2019-10-05 ENCOUNTER — Telehealth: Payer: 59 | Admitting: Family

## 2019-10-05 DIAGNOSIS — U071 COVID-19: Secondary | ICD-10-CM | POA: Diagnosis not present

## 2019-10-05 MED ORDER — ALBUTEROL SULFATE HFA 108 (90 BASE) MCG/ACT IN AERS: 2.0000 | INHALATION_SPRAY | Freq: Four times a day (QID) | RESPIRATORY_TRACT | 0 refills | Status: DC | PRN

## 2019-10-05 MED ORDER — BENZONATATE 100 MG PO CAPS: 100.0000 mg | ORAL_CAPSULE | Freq: Three times a day (TID) | ORAL | 0 refills | Status: DC | PRN

## 2019-10-05 NOTE — Progress Notes (Signed)
E-Visit for Corona Virus Screening  We are sorry you are not feeling well. We are here to help!  You have tested positive for COVID-19, meaning that you were infected with the novel coronavirus and could give the germ to others.    You have been enrolled in MyChart Home Monitoring for COVID-19. Daily you will receive a questionnaire within the MyChart website. Our COVID-19 response team will be monitoring your responses daily.  Please continue isolation at home, for at least 10 days since the start of your symptoms and until you have had 24 hours with no fever (without taking a fever reducer) and with improving of symptoms.  Please continue good preventive care measures, including:  frequent hand-washing, avoid touching your face, cover coughs/sneezes, stay out of crowds and keep a 6 foot distance from others.  Follow up with your provider or go to the nearest hospital ED for re-assessment if fever/cough/breathlessness return.  The following symptoms may appear 2-14 days after exposure: . Fever . Cough . Shortness of breath or difficulty breathing . Chills . Repeated shaking with chills . Muscle pain . Headache . Sore throat . New loss of taste or smell . Fatigue . Congestion or runny nose . Nausea or vomiting . Diarrhea  Go to the nearest hospital ED for assessment if fever/cough/breathlessness are severe or illness seems like a threat to life.  It is vitally important that if you feel that you have an infection such as this virus or any other virus that you stay home and away from places where you may spread it to others.  You should avoid contact with people age 65 and older.   You can use medication such as A prescription cough medication called Tessalon Perles 100 mg. You may take 1-2 capsules every 8 hours as needed for cough and A prescription inhaler called Albuterol MDI 90 mcg /actuation 2 puffs every 4 hours as needed for shortness of breath, wheezing, cough  You may also  take acetaminophen (Tylenol) as needed for fever.  Reduce your risk of any infection by using the same precautions used for avoiding the common cold or flu:  . Wash your hands often with soap and warm water for at least 20 seconds.  If soap and water are not readily available, use an alcohol-based hand sanitizer with at least 60% alcohol.  . If coughing or sneezing, cover your mouth and nose by coughing or sneezing into the elbow areas of your shirt or coat, into a tissue or into your sleeve (not your hands). . Avoid shaking hands with others and consider head nods or verbal greetings only. . Avoid touching your eyes, nose, or mouth with unwashed hands.  . Avoid close contact with people who are sick. . Avoid places or events with large numbers of people in one location, like concerts or sporting events. . Carefully consider travel plans you have or are making. . If you are planning any travel outside or inside the [place], visit the CDC's Travelers' Health webpage for the latest health notices. . If you have some symptoms but not all symptoms, continue to monitor at home and seek medical attention if your symptoms worsen. . If you are having a medical emergency, call 911.  HOME CARE . Only take medications as instructed by your medical team. . Drink plenty of fluids and get plenty of rest. . A steam or ultrasonic humidifier can help if you have congestion.   GET HELP RIGHT AWAY IF YOU HAVE   EMERGENCY WARNING SIGNS** FOR COVID-19. If you or someone is showing any of these signs seek emergency medical care immediately. Call 911 or proceed to your closest emergency facility if: . You develop worsening high fever. . Trouble breathing . Bluish lips or face . Persistent pain or pressure in the chest . New confusion . Inability to wake or stay awake . You cough up blood. . Your symptoms become more severe  **This list is not all possible symptoms. Contact your medical provider for any symptoms that  are sever or concerning to you.  MAKE SURE YOU   Understand these instructions.  Will watch your condition.  Will get help right away if you are not doing well or get worse.  Your e-visit answers were reviewed by a board certified advanced clinical practitioner to complete your personal care plan.  Depending on the condition, your plan could have included both over the counter or prescription medications.  If there is a problem please reply once you have received a response from your provider.  Your safety is important to us.  If you have drug allergies check your prescription carefully.    You can use MyChart to ask questions about today's visit, request a non-urgent call back, or ask for a work or school excuse for 24 hours related to this e-Visit. If it has been greater than 24 hours you will need to follow up with your provider, or enter a new e-Visit to address those concerns. You will get an e-mail in the next two days asking about your experience.  I hope that your e-visit has been valuable and will speed your recovery. Thank you for using e-visits.   Approximately 5 minutes was spent documenting and reviewing patient's chart.      

## 2020-01-27 ENCOUNTER — Encounter: Payer: Self-pay | Admitting: Family Medicine

## 2020-01-27 ENCOUNTER — Other Ambulatory Visit: Payer: Self-pay | Admitting: Family Medicine

## 2020-01-29 MED ORDER — LISDEXAMFETAMINE DIMESYLATE 40 MG PO CAPS
40.0000 mg | ORAL_CAPSULE | ORAL | 0 refills | Status: DC
Start: 2020-01-29 — End: 2020-02-01

## 2020-01-29 MED ORDER — METHYLPHENIDATE HCL 5 MG PO TABS
5.0000 mg | ORAL_TABLET | Freq: Every day | ORAL | 0 refills | Status: DC
Start: 2020-01-29 — End: 2020-10-26

## 2020-02-01 ENCOUNTER — Telehealth (INDEPENDENT_AMBULATORY_CARE_PROVIDER_SITE_OTHER): Payer: 59 | Admitting: Family Medicine

## 2020-02-01 DIAGNOSIS — F909 Attention-deficit hyperactivity disorder, unspecified type: Secondary | ICD-10-CM

## 2020-02-01 DIAGNOSIS — G8929 Other chronic pain: Secondary | ICD-10-CM

## 2020-02-01 DIAGNOSIS — M546 Pain in thoracic spine: Secondary | ICD-10-CM

## 2020-02-01 HISTORY — DX: Pain in thoracic spine: M54.6

## 2020-02-01 MED ORDER — LISDEXAMFETAMINE DIMESYLATE 40 MG PO CAPS: 40.0000 mg | ORAL_CAPSULE | ORAL | 0 refills | Status: DC

## 2020-02-01 NOTE — Assessment & Plan Note (Signed)
Adequately controlled.  He will continue Vyvanse 40 mg once daily.  He can continue the Ritalin in the afternoon when he needs it.  I will refill his Vyvanse.

## 2020-02-01 NOTE — Assessment & Plan Note (Signed)
Ongoing issue.  No evidence of radicular symptoms based on history.  I will write a letter to see if he can get a better chair for work.  Discussed potentially seeing physical therapy if this change in chairs is not beneficial.  Advised to seek medical attention for any incontinence episodes associated with back pain.

## 2020-02-01 NOTE — Progress Notes (Signed)
Virtual Visit via video Note  This visit type was conducted due to national recommendations for restrictions regarding the COVID-19 pandemic (e.g. social distancing).  This format is felt to be most appropriate for this patient at this time.  All issues noted in this document were discussed and addressed.  No physical exam was performed (except for noted visual exam findings with Video Visits).   I connected with Steve Benjamin today at  9:30 AM EST by a video enabled telemedicine application and verified that I am speaking with the correct person using two identifiers. Location patient: home Location provider: home office Persons participating in the virtual visit: patient, provider  I discussed the limitations, risks, security and privacy concerns of performing an evaluation and management service by telephone and the availability of in person appointments. I also discussed with the patient that there may be a patient responsible charge related to this service. The patient expressed understanding and agreed to proceed.  Reason for visit: f/u  HPI: Thoracic back pain: Patient notes this has been an ongoing issue related possibly to the chair that he uses for work.  He notes pain in his thoracic region into his shoulder blades bilaterally.  No numbness, weakness, radiation, or incontinence.  No known injury.  He saw a chiropractor last week he felt as though it may be related to a pinched nerve in his neck though he notes no radicular symptoms.  ADHD: Taking Vyvanse 40 mg once daily.  Only occasionally takes the Ritalin 5 mg once daily.  No appetite suppression, sleep changes, or palpitations.   ROS: See pertinent positives and negatives per HPI.  Past Medical History:  Diagnosis Date  . Depression   . Headache     Past Surgical History:  Procedure Laterality Date  . NO PAST SURGERIES      Family History  Problem Relation Age of Onset  . Arthritis Paternal Uncle   .  Hyperlipidemia Maternal Grandfather   . Heart disease Maternal Grandfather   . Cancer Paternal Grandfather   . Heart disease Paternal Grandfather   . Alzheimer's disease Paternal Grandfather     SOCIAL HX: Former smoker   Current Outpatient Medications:  .  albuterol (VENTOLIN HFA) 108 (90 Base) MCG/ACT inhaler, Inhale 2 puffs into the lungs every 6 (six) hours as needed for wheezing or shortness of breath., Disp: 8 g, Rfl: 0 .  benzonatate (TESSALON PERLES) 100 MG capsule, Take 1 capsule (100 mg total) by mouth 3 (three) times daily as needed., Disp: 20 capsule, Rfl: 0 .  buPROPion (WELLBUTRIN XL) 150 MG 24 hr tablet, Take 1 tablet (150 mg total) by mouth daily., Disp: 90 tablet, Rfl: 1 .  [START ON 05/01/2020] lisdexamfetamine (VYVANSE) 40 MG capsule, Take 1 capsule (40 mg total) by mouth every morning., Disp: 30 capsule, Rfl: 0 .  [START ON 03/31/2020] lisdexamfetamine (VYVANSE) 40 MG capsule, Take 1 capsule (40 mg total) by mouth every morning., Disp: 30 capsule, Rfl: 0 .  [START ON 03/03/2020] lisdexamfetamine (VYVANSE) 40 MG capsule, Take 1 capsule (40 mg total) by mouth every morning., Disp: 30 capsule, Rfl: 0 .  methylphenidate (RITALIN) 5 MG tablet, Take 1 tablet (5 mg total) by mouth daily in the afternoon., Disp: 30 tablet, Rfl: 0 .  methylphenidate (RITALIN) 5 MG tablet, Take 1 tablet (5 mg total) by mouth daily in the afternoon., Disp: 30 tablet, Rfl: 0 .  methylphenidate (RITALIN) 5 MG tablet, Take 1 tablet (5 mg total) by mouth daily  in the afternoon., Disp: 30 tablet, Rfl: 0  EXAM:  VITALS per patient if applicable:  GENERAL: alert, oriented, appears well and in no acute distress  HEENT: atraumatic, conjunttiva clear, no obvious abnormalities on inspection of external nose and ears  NECK: normal movements of the head and neck  LUNGS: on inspection no signs of respiratory distress, breathing rate appears normal, no obvious gross SOB, gasping or wheezing  CV: no obvious  cyanosis  MS: moves all visible extremities without noticeable abnormality  PSYCH/NEURO: pleasant and cooperative, no obvious depression or anxiety, speech and thought processing grossly intact  ASSESSMENT AND PLAN:  Discussed the following assessment and plan:  Problem List Items Addressed This Visit    ADHD    Adequately controlled.  He will continue Vyvanse 40 mg once daily.  He can continue the Ritalin in the afternoon when he needs it.  I will refill his Vyvanse.      Relevant Medications   lisdexamfetamine (VYVANSE) 40 MG capsule (Start on 05/01/2020)   lisdexamfetamine (VYVANSE) 40 MG capsule (Start on 03/31/2020)   lisdexamfetamine (VYVANSE) 40 MG capsule (Start on 03/03/2020)   Thoracic back pain    Ongoing issue.  No evidence of radicular symptoms based on history.  I will write a letter to see if he can get a better chair for work.  Discussed potentially seeing physical therapy if this change in chairs is not beneficial.  Advised to seek medical attention for any incontinence episodes associated with back pain.          I discussed the assessment and treatment plan with the patient. The patient was provided an opportunity to ask questions and all were answered. The patient agreed with the plan and demonstrated an understanding of the instructions.   The patient was advised to call back or seek an in-person evaluation if the symptoms worsen or if the condition fails to improve as anticipated.   Marikay Alar, MD

## 2020-03-25 ENCOUNTER — Other Ambulatory Visit: Payer: Self-pay

## 2020-03-25 ENCOUNTER — Encounter: Payer: Self-pay | Admitting: Internal Medicine

## 2020-03-25 ENCOUNTER — Telehealth (INDEPENDENT_AMBULATORY_CARE_PROVIDER_SITE_OTHER): Payer: 59 | Admitting: Internal Medicine

## 2020-03-25 ENCOUNTER — Other Ambulatory Visit (INDEPENDENT_AMBULATORY_CARE_PROVIDER_SITE_OTHER): Payer: 59

## 2020-03-25 VITALS — HR 86 | Ht 68.0 in | Wt 155.0 lb

## 2020-03-25 DIAGNOSIS — J029 Acute pharyngitis, unspecified: Secondary | ICD-10-CM | POA: Diagnosis not present

## 2020-03-25 DIAGNOSIS — Z20822 Contact with and (suspected) exposure to covid-19: Secondary | ICD-10-CM | POA: Diagnosis not present

## 2020-03-25 DIAGNOSIS — R509 Fever, unspecified: Secondary | ICD-10-CM | POA: Diagnosis not present

## 2020-03-25 DIAGNOSIS — R059 Cough, unspecified: Secondary | ICD-10-CM

## 2020-03-25 LAB — POCT RAPID STREP A (OFFICE): Rapid Strep A Screen: NEGATIVE

## 2020-03-25 MED ORDER — IPRATROPIUM-ALBUTEROL 0.5-2.5 (3) MG/3ML IN SOLN: 3.0000 mL | Freq: Four times a day (QID) | RESPIRATORY_TRACT | 0 refills | Status: DC | PRN

## 2020-03-25 MED ORDER — HYDROCOD POLST-CPM POLST ER 10-8 MG/5ML PO SUER: 5.0000 mL | Freq: Every evening | ORAL | 0 refills | Status: DC | PRN

## 2020-03-25 MED ORDER — DM-GUAIFENESIN ER 60-1200 MG PO TB12: 1.0000 | ORAL_TABLET | Freq: Two times a day (BID) | ORAL | 0 refills | Status: DC | PRN

## 2020-03-25 MED ORDER — AZITHROMYCIN 250 MG PO TABS: ORAL_TABLET | ORAL | 0 refills | Status: DC

## 2020-03-25 NOTE — Patient Instructions (Addendum)
There is no medication other than over the counter meds:  Mucinex dm green label or Robitussin DM for cough.  Vitamin C 1000 mg daily.  Vitamin D3 4000 Iu (units) daily.  Zinc 100 mg daily.  Quercetin 250-500 mg 2 times per day   Elderberry  Oil of oregano   cepacol lozenges or chloroseptic spray  Warm salt water gargles  Warm tea with honey and lemon  Cough drops  Hydration with water  Try to eat though you dont feel like it   Tylenol or Advil  Nasal saline , Flonase -2 sprays of each  Allegra as needed at night Use the nebulizer machine every 4-6 hours as needed with DUONEB medication  Monitor pulse oximeter, buy from [place] if oxygen is less than 90 please go to the hospital.  Are you feeling really sick? Shortness of breath, cough, chest pain?, dizziness? Confusion   If so let me know  If worsening, go to hospital or [facility] clinic Urgent care for further treatment

## 2020-03-25 NOTE — Progress Notes (Signed)
Onset of symptoms Sunday morning. Patient woke up with a sore throat also had a fever.  Patient's wife and child were sick as well. Patient's child is 32 years old. Patient had symptoms first.  Cough, congestion, sore throat, fever. Covid test is negative

## 2020-03-25 NOTE — Progress Notes (Signed)
Telephone Note  I connected with Steve Benjamin   on 03/25/20 at  3:00 PM EST by telephone and verified that I am speaking with the correct person using two identifiers.  Location patient: car Location provider:work or home office Persons participating in the virtual visit: patient, provider  I discussed the limitations of evaluation and management by telemedicine and the availability of in person appointments. The patient expressed understanding and agreed to proceed.   HPI: 2-3 days had stuffy nose, cough, h/a sore throat fever not today though. He was sick 1st then son 5 y.o not in daycare and wife. He does feel some sob. He purchased allegra, bone broth w/o relief his child has a nebulizer machine, cough with green mucous    He reports tested neg covid but had it 09/2019 : -COVID-19 vaccine status: not vaccinated  ROS: See pertinent positives and negatives per HPI.  Past Medical History:  Diagnosis Date  . Depression   . Headache     Past Surgical History:  Procedure Laterality Date  . NO PAST SURGERIES       Current Outpatient Medications:  .  azithromycin (ZITHROMAX) 250 MG tablet, 2 pills day 1 and 1 pill day 2-5 with food, Disp: 6 tablet, Rfl: 0 .  buPROPion (WELLBUTRIN XL) 150 MG 24 hr tablet, Take 1 tablet (150 mg total) by mouth daily., Disp: 90 tablet, Rfl: 1 .  chlorpheniramine-HYDROcodone (TUSSIONEX PENNKINETIC ER) 10-8 MG/5ML SUER, Take 5 mLs by mouth at bedtime as needed., Disp: 115 mL, Rfl: 0 .  Dextromethorphan-Guaifenesin 60-1200 MG 12hr tablet, Take 1 tablet by mouth every 12 (twelve) hours as needed. Green label, Disp: 60 tablet, Rfl: 0 .  ipratropium-albuterol (DUONEB) 0.5-2.5 (3) MG/3ML SOLN, Take 3 mLs by nebulization every 6 (six) hours as needed., Disp: 60 mL, Rfl: 0 .  [START ON 05/01/2020] lisdexamfetamine (VYVANSE) 40 MG capsule, Take 1 capsule (40 mg total) by mouth every morning., Disp: 30 capsule, Rfl: 0 .  [START ON 03/31/2020] lisdexamfetamine  (VYVANSE) 40 MG capsule, Take 1 capsule (40 mg total) by mouth every morning., Disp: 30 capsule, Rfl: 0 .  lisdexamfetamine (VYVANSE) 40 MG capsule, Take 1 capsule (40 mg total) by mouth every morning., Disp: 30 capsule, Rfl: 0 .  methylphenidate (RITALIN) 5 MG tablet, Take 1 tablet (5 mg total) by mouth daily in the afternoon., Disp: 30 tablet, Rfl: 0 .  methylphenidate (RITALIN) 5 MG tablet, Take 1 tablet (5 mg total) by mouth daily in the afternoon., Disp: 30 tablet, Rfl: 0 .  methylphenidate (RITALIN) 5 MG tablet, Take 1 tablet (5 mg total) by mouth daily in the afternoon., Disp: 30 tablet, Rfl: 0 .  albuterol (VENTOLIN HFA) 108 (90 Base) MCG/ACT inhaler, Inhale 2 puffs into the lungs every 6 (six) hours as needed for wheezing or shortness of breath. (Patient not taking: Reported on 03/25/2020), Disp: 8 g, Rfl: 0 .  benzonatate (TESSALON PERLES) 100 MG capsule, Take 1 capsule (100 mg total) by mouth 3 (three) times daily as needed. (Patient not taking: Reported on 03/25/2020), Disp: 20 capsule, Rfl: 0  EXAM:  VITALS per patient if applicable:  GENERAL: alert, oriented, appears well and in no acute distress  LUNGS: on inspection no signs of respiratory distress, breathing rate appears normal, no obvious gross SOB, gasping or wheezing +consistently cough on exam   PSYCH/NEURO: pleasant and cooperative, no obvious depression or anxiety, speech and thought processing grossly intact  ASSESSMENT AND PLAN:  Discussed the following assessment and plan:  Fever r/o covid vs other etiology - Plan: COVID-19, Flu A+B and RSV, POCT rapid strep A Sore throat - Plan: COVID-19, Flu A+B and RSV, POCT rapid strep A, azithromycin (ZITHROMAX) 250 MG tablet Dextromethorphan-Guaifenesin 60-1200 MG 12hr tablet, chlorpheniramine-HYDROcodone (TUSSIONEX PENNKINETIC ER) 10-8 MG/5ML SUER, ipratropium-albuterol (DUONEB) 0.5-2.5 (3) MG/3ML SOLN  Pt wants to hold on CXR and steroids for now   There is no medication  other than over the counter meds:  Mucinex dm green label or Robitussin DM for cough.  Vitamin C 1000 mg daily.  Vitamin D3 4000 Iu (units) daily.  Zinc 100 mg daily.  Quercetin 250-500 mg 2 times per day   Elderberry  Oil of oregano   cepacol lozenges or chloroseptic spray  Warm salt water gargles  Warm tea with honey and lemon  Cough drops  Hydration with water  Try to eat though you dont feel like it   Tylenol or Advil  Nasal saline , Flonase -2 sprays of each  Allegra as needed at night Use the nebulizer machine every 4-6 hours as needed with DUONEB medication  Monitor pulse oximeter, buy from [place] if oxygen is less than 90 please go to the hospital.  Are you feeling really sick? Shortness of breath, cough, chest pain?, dizziness? Confusion   If so let me know  If worsening, go to hospital or [facility] clinic Urgent care for further treatment   -we discussed possible serious and likely etiologies, options for evaluation and workup, limitations of telemedicine visit vs in person visit, treatment, treatment risks and precautions.   I discussed the assessment and treatment plan with the patient. The patient was provided an opportunity to ask questions and all were answered. The patient agreed with the plan and demonstrated an understanding of the instructions.    Time spent 20 min Bevelyn Buckles, MD

## 2020-03-27 LAB — COVID-19, FLU A+B AND RSV
Influenza A, NAA: NOT DETECTED
Influenza B, NAA: NOT DETECTED
RSV, NAA: NOT DETECTED
SARS-CoV-2, NAA: NOT DETECTED

## 2020-04-01 ENCOUNTER — Other Ambulatory Visit: Payer: Self-pay | Admitting: Family Medicine

## 2020-05-02 ENCOUNTER — Ambulatory Visit: Payer: 59 | Admitting: Family Medicine

## 2020-05-02 DIAGNOSIS — Z0289 Encounter for other administrative examinations: Secondary | ICD-10-CM

## 2020-09-14 ENCOUNTER — Telehealth: Payer: Self-pay | Admitting: Family Medicine

## 2020-09-14 ENCOUNTER — Other Ambulatory Visit: Payer: Self-pay | Admitting: Family Medicine

## 2020-09-14 DIAGNOSIS — F909 Attention-deficit hyperactivity disorder, unspecified type: Secondary | ICD-10-CM

## 2020-09-14 MED ORDER — LISDEXAMFETAMINE DIMESYLATE 40 MG PO CAPS: 40.0000 mg | ORAL_CAPSULE | ORAL | 0 refills | Status: DC

## 2020-09-14 NOTE — Telephone Encounter (Signed)
Patient came in to see if there are any cheaper options or a generic for his lisdexamfetamine (VYVANSE) 40 MG capsule due to the expensive cost of this medicine. If there are he would like for it to be sent to the [facility] on [address],please advise. Hridaan Bouse,cma

## 2020-09-14 NOTE — Telephone Encounter (Signed)
He has not been seen in person in over a year.  His upcoming visit needs to be in person.  Please call him and inform him of this.  If it is not in person I will not be able to refill the Vyvanse at that time as we do need an in person visit with him at least every 6 months while taking this medication and he needs to consistently follow-up every 3 months for refills on this medication moving forward.  I will send in a courtesy refill at this time.

## 2020-09-14 NOTE — Telephone Encounter (Signed)
Patient came in to see if there are any cheaper options or a generic for his lisdexamfetamine (VYVANSE) 40 MG capsule due to the expensive cost of this medicine. If there are he would like for [facility] to [facility] to [facility] on [address],please advise.

## 2020-09-16 NOTE — Telephone Encounter (Signed)
There might be something cheaper though it will depend on his insurance. We will need to discuss a change in medication during his office visit.

## 2020-09-16 NOTE — Telephone Encounter (Signed)
I called the patient and he stated he had no vyvanse left and it was $300.00 He stated the Ritalin was not expensive and he had been using it as a booster and he stated if you could give him that back it would be fine.  Jody Aguinaga,cma

## 2020-09-19 NOTE — Telephone Encounter (Signed)
See medication refill encounter. 

## 2020-09-19 NOTE — Telephone Encounter (Signed)
As this is a controlled substance we would need to complete an office visit to discuss medication changes.  I can try to find a sooner spot for him to come in to discuss this if he would like.

## 2020-09-20 NOTE — Telephone Encounter (Signed)
Called and spoke with [name]. Steve Benjamin verbalized understanding and is agreeable to coming in sooner to discuss his medication change.

## 2020-09-20 NOTE — Telephone Encounter (Signed)
I can add him on a 1 pm on 10/04/20.

## 2020-09-26 NOTE — Telephone Encounter (Signed)
Left a message to call back and reschedule to 10/04/20 at 1pm per Dr. Birdie Sons.

## 2020-09-27 NOTE — Telephone Encounter (Signed)
Called to change Steve Benjamin appointment to 10/04/20 at 1pm per Dr.Sonnenberg. Left a message to call back. Mychart message has been sent to the patient requesting a call back to the office.

## 2020-09-28 NOTE — Telephone Encounter (Signed)
Steve Benjamin called back to discuss his appointment. He states that he can not schedule for 10/04/20 at 1 pm due to having a meeting at that time. He confirmed his appointment at 10/26/20 at 11:30am with Dr. Birdie Sons and states that that is fine.

## 2020-10-26 ENCOUNTER — Ambulatory Visit (INDEPENDENT_AMBULATORY_CARE_PROVIDER_SITE_OTHER): Payer: Self-pay | Admitting: Family Medicine

## 2020-10-26 ENCOUNTER — Other Ambulatory Visit: Payer: Self-pay

## 2020-10-26 ENCOUNTER — Encounter: Payer: Self-pay | Admitting: Family Medicine

## 2020-10-26 VITALS — BP 130/80 | HR 97 | Temp 97.9°F | Ht 68.0 in | Wt 171.0 lb

## 2020-10-26 DIAGNOSIS — F325 Major depressive disorder, single episode, in full remission: Secondary | ICD-10-CM

## 2020-10-26 DIAGNOSIS — Z13220 Encounter for screening for lipoid disorders: Secondary | ICD-10-CM

## 2020-10-26 DIAGNOSIS — F909 Attention-deficit hyperactivity disorder, unspecified type: Secondary | ICD-10-CM

## 2020-10-26 LAB — COMPREHENSIVE METABOLIC PANEL
ALT: 17 U/L (ref 0–53)
AST: 20 U/L (ref 0–37)
Albumin: 4.9 g/dL (ref 3.5–5.2)
Alkaline Phosphatase: 48 U/L (ref 39–117)
BUN: 12 mg/dL (ref 6–23)
CO2: 31 mEq/L (ref 19–32)
Calcium: 10.1 mg/dL (ref 8.4–10.5)
Chloride: 103 mEq/L (ref 96–112)
Creatinine, Ser: 0.89 mg/dL (ref 0.40–1.50)
GFR: 113.66 mL/min (ref >60.00)
Glucose, Bld: 68 mg/dL — ABNORMAL LOW (ref 70–99)
Potassium: 4.5 mEq/L (ref 3.5–5.1)
Sodium: 141 mEq/L (ref 135–145)
Total Bilirubin: 0.5 mg/dL (ref 0.2–1.2)
Total Protein: 7.5 g/dL (ref 6.0–8.3)

## 2020-10-26 LAB — LIPID PANEL
Cholesterol: 217 mg/dL — ABNORMAL HIGH (ref 0–200)
HDL: 67.1 mg/dL (ref >39.00)
LDL Cholesterol: 132 mg/dL — ABNORMAL HIGH (ref 0–99)
NonHDL: 150.13
Total CHOL/HDL Ratio: 3
Triglycerides: 92 mg/dL (ref 0.0–149.0)
VLDL: 18.4 mg/dL (ref 0.0–40.0)

## 2020-10-26 NOTE — Patient Instructions (Signed)
Nice to see you. We will get some lab work today. Please look at the information on Strattera and let me know how you would prefer to proceed.

## 2020-10-26 NOTE — Progress Notes (Signed)
  Tommi Rumps, MD Phone: [phone]  Steve Benjamin is a 30 y.o. male who presents today for f/u.  ADHD: Patient has been off of his Vyvanse and Ritalin.  He notes Vyvanse is too expensive.  He is concerned about taking stimulants long-term and wants to consider nonstimulant options.  He notes he read about modafinil as an option.  Depression: Patient notes no depressive symptoms.  No anxiety.  He is no longer on Wellbutrin.  Social History   Tobacco Use  Smoking Status Former   Packs/day: 1.00   Types: Cigarettes   Quit date: 08/15/2016   Years since quitting: 4.2  Smokeless Tobacco Former    No current outpatient medications on file prior to visit.   No current facility-administered medications on file prior to visit.     ROS see history of present illness  Objective  Physical Exam Vitals:   10/26/20 1131  BP: 130/80  Pulse: 97  Temp: 97.9 F (36.6 C)  SpO2: 99%    BP Readings from Last 3 Encounters:  10/26/20 130/80  04/08/19 120/80  01/28/17 116/86   Wt Readings from Last 3 Encounters:  10/26/20 171 lb (77.6 kg)  03/25/20 155 lb (70.3 kg)  09/29/19 153 lb (69.4 kg)    Physical Exam Constitutional:      General: He is not in acute distress.    Appearance: He is not diaphoretic.  Cardiovascular:     Rate and Rhythm: Normal rate and regular rhythm.     Heart sounds: Normal heart sounds.  Pulmonary:     Effort: Pulmonary effort is normal.     Breath sounds: Normal breath sounds.  Skin:    General: Skin is warm and dry.  Neurological:     Mental Status: He is alert.     Assessment/Plan: Please see individual problem list.  Problem List Items Addressed This Visit     ADHD    Not on medication.  He would like to try a nonstimulant.  I discussed that Strattera is the nonstimulant medication that I typically would go to.  Discussed that I would have to review modafinil and armodafinil to see if this would be adequate options.  I provided the  patient with information on Strattera and he will let me know how he would prefer to proceed.  Discussed if he tried a nonstimulant and it was not beneficial he could let me know and we could restart him on a stimulant.      Depression    Asymptomatic.  He will monitor for any recurrence.      Other Visit Diagnoses     Lipid screening    -  Primary   Relevant Orders   Lipid panel   Comp Met (CMET)       Return in about 3 months (around 01/26/2021) for ADHD.  This visit occurred during the SARS-CoV-2 public health emergency.  Safety protocols were in place, including screening questions prior to the visit, additional usage of staff PPE, and extensive cleaning of exam room while observing appropriate contact time as indicated for disinfecting solutions.    Tommi Rumps, MD [place]

## 2020-10-26 NOTE — Assessment & Plan Note (Signed)
Asymptomatic.  He will monitor for any recurrence.

## 2020-10-26 NOTE — Assessment & Plan Note (Signed)
Not on medication.  He would like to try a nonstimulant.  I discussed that Strattera is the nonstimulant medication that I typically would go to.  Discussed that I would have to review modafinil and armodafinil to see if this would be adequate options.  I provided the patient with information on Strattera and he will let me know how he would prefer to proceed.  Discussed if he tried a nonstimulant and it was not beneficial he could let me know and we could restart him on a stimulant.

## 2020-10-27 ENCOUNTER — Encounter: Payer: Self-pay | Admitting: Family Medicine

## 2020-10-31 NOTE — Telephone Encounter (Signed)
Do you know anything about modafinil for ADHD? From what I have read it would be off label and there are only small studies that indicate that it could be effective. This patient brought it up during a recent visit and requested it, though I advised at the time of the visit that I would have to look in to it first. Just wanted to see if you knew anything about the use of this for ADHD.

## 2020-11-04 MED ORDER — METHYLPHENIDATE HCL ER (OSM) 18 MG PO TBCR: 18.0000 mg | EXTENDED_RELEASE_TABLET | Freq: Every day | ORAL | 0 refills | Status: DC

## 2020-12-20 ENCOUNTER — Other Ambulatory Visit: Payer: Self-pay | Admitting: Family Medicine

## 2020-12-20 NOTE — Telephone Encounter (Addendum)
RX Refill: Concerta Last Seen: 10-26-20 Last Ordered: 11-04-20 Next Appt: 02-20-21

## 2020-12-21 MED ORDER — METHYLPHENIDATE HCL ER (OSM) 18 MG PO TBCR: 18.0000 mg | EXTENDED_RELEASE_TABLET | Freq: Every day | ORAL | 0 refills | Status: DC

## 2020-12-21 NOTE — Telephone Encounter (Signed)
Spoken to patient, he stated he will check his BP and pulse at home.

## 2020-12-21 NOTE — Telephone Encounter (Signed)
Can you call the patient and see if he can either check his BP and pulse at home and get me the readings or get him scheduled for a nurse BP/pulse check to follow-up up on those things given that he has been recently started on the concerta? I sent his refill in.

## 2020-12-26 ENCOUNTER — Encounter: Payer: Self-pay | Admitting: Family Medicine

## 2020-12-27 NOTE — Telephone Encounter (Signed)
Please call the patient. If he is still having hives he needs to be seen today in person for an evaluation of this issue. If they have resolved we could get him set up in the office in the next couple of weeks for evaluation.

## 2020-12-27 NOTE — Telephone Encounter (Signed)
Patient is scheduled to see Steve Benjamin on Thursday for his hives.  Yee Gangi,cma

## 2020-12-29 ENCOUNTER — Ambulatory Visit: Payer: Medicaid Other | Admitting: Family

## 2021-02-20 ENCOUNTER — Ambulatory Visit: Payer: Medicaid Other | Admitting: Family Medicine

## 2021-05-22 ENCOUNTER — Ambulatory Visit: Payer: Medicaid Other | Admitting: Family Medicine

## 2021-05-31 ENCOUNTER — Telehealth: Payer: Self-pay | Admitting: Family Medicine

## 2021-05-31 ENCOUNTER — Ambulatory Visit (INDEPENDENT_AMBULATORY_CARE_PROVIDER_SITE_OTHER): Payer: Managed Care, Other (non HMO) | Admitting: Family Medicine

## 2021-05-31 ENCOUNTER — Encounter: Payer: Self-pay | Admitting: Family Medicine

## 2021-05-31 DIAGNOSIS — Z202 Contact with and (suspected) exposure to infections with a predominantly sexual mode of transmission: Secondary | ICD-10-CM

## 2021-05-31 DIAGNOSIS — F909 Attention-deficit hyperactivity disorder, unspecified type: Secondary | ICD-10-CM | POA: Diagnosis not present

## 2021-05-31 MED ORDER — LISDEXAMFETAMINE DIMESYLATE 30 MG PO CAPS: 30.0000 mg | ORAL_CAPSULE | Freq: Every day | ORAL | 0 refills | Status: DC

## 2021-05-31 NOTE — Assessment & Plan Note (Signed)
We will trial Vyvanse 30 mg once daily.  He will see how he does over the next 1 to 2 weeks and if its not effective he will let me know we can increase the dose.  He will follow-up in about a month. ?

## 2021-05-31 NOTE — Progress Notes (Signed)
?  Steve Rumps, MD ?Phone: [phone] ? ?Steve Benjamin is a 31 y.o. male who presents today for f/u. ? ?ADHD ?Medication: not currently on medications, noted that he has new insurance and wants to go back to taking Vyvanse as it seemed to work the best. ? ?HPV exposure: Patient reports his wife was recently is by she had HPV on her Pap smear. ? ? ?Social History  ? ?Tobacco Use  ?Smoking Status Former  ? Packs/day: 1.00  ? Types: Cigarettes  ? Quit date: 08/15/2016  ? Years since quitting: 4.7  ?Smokeless Tobacco Former  ? ? ?No current outpatient medications on file prior to visit.  ? ?No current facility-administered medications on file prior to visit.  ? ? ? ?ROS see history of present illness ? ?Objective ? ?Physical Exam ?Vitals:  ? 05/31/21 1135  ?BP: 120/80  ?Pulse: 81  ?Temp: 98.6 ?F (37 ?C)  ?SpO2: 99%  ? ? ?BP Readings from Last 3 Encounters:  ?05/31/21 120/80  ?10/26/20 130/80  ?04/08/19 120/80  ? ?Wt Readings from Last 3 Encounters:  ?05/31/21 174 lb 3.2 oz (79 kg)  ?10/26/20 171 lb (77.6 kg)  ?03/25/20 155 lb (70.3 kg)  ? ? ?Physical Exam ?Constitutional:   ?   General: He is not in acute distress. ?   Appearance: He is not diaphoretic.  ?Cardiovascular:  ?   Rate and Rhythm: Normal rate and regular rhythm.  ?   Heart sounds: Normal heart sounds.  ?Pulmonary:  ?   Effort: Pulmonary effort is normal.  ?   Breath sounds: Normal breath sounds.  ?Skin: ?   General: Skin is warm and dry.  ?Neurological:  ?   Mental Status: He is alert.  ? ? ? ?Assessment/Plan: Please see individual problem list. ? ?Problem List Items Addressed This Visit   ? ? ADHD  ?  We will trial Vyvanse 30 mg once daily.  He will see how he does over the next 1 to 2 weeks and if its not effective he will let me know we can increase the dose.  He will follow-up in about a month. ? ?  ?  ? HPV exposure  ?  Discussed that this is a sexually transmitted disease.  There is no way to tell exactly when this would have been contracted by  his wife.  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  Discussed that there is no testing available for this.  Discussed risk of cancer related to this and advised to monitor for penile lesions or lesions in his throat.  Discussed HPV vaccination is approved through age 38 though is not treatment for those that have already had HPV.  Discussed that his insurance may not cover HPV vaccination.  He will check with his insurance regarding coverage and then determine if he would like to proceed with HPV vaccination.  Discussed that condoms could reduce the risk of transmission.  Discussed that the sexually transmitted types of HPV are essentially only sexually transmitted. ? ?  ?  ? ?Return in about 1 month (around 07/01/2021) for ADHD. ? ? ?Steve Rumps, MD ?[place] ? ?

## 2021-05-31 NOTE — Patient Instructions (Signed)
Nice to see you. ?We will restart your Vyvanse at 30 mg once daily.  If this is not effective within 1 to 2 weeks please let us know.  If you develop any palpitations, appetite suppression, or sleep issues with this please let us know. ?

## 2021-05-31 NOTE — Assessment & Plan Note (Signed)
Discussed that this is a sexually transmitted disease.  There is no way to tell exactly when this would have been contracted by his wife.  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  Discussed that there is no testing available for this.  Discussed risk of cancer related to this and advised to monitor for penile lesions or lesions in his throat.  Discussed HPV vaccination is approved through age 48 though is not treatment for those that have already had HPV.  Discussed that his insurance may not cover HPV vaccination.  He will check with his insurance regarding coverage and then determine if he would like to proceed with HPV vaccination.  Discussed that condoms could reduce the risk of transmission.  Discussed that the sexually transmitted types of HPV are essentially only sexually transmitted. ?

## 2021-05-31 NOTE — Telephone Encounter (Signed)
Pt need medical necessity letter for medication-lisdexamfetamine ? ?

## 2021-06-01 ENCOUNTER — Encounter: Payer: Self-pay | Admitting: Family Medicine

## 2021-06-01 DIAGNOSIS — F909 Attention-deficit hyperactivity disorder, unspecified type: Secondary | ICD-10-CM

## 2021-06-01 NOTE — Telephone Encounter (Signed)
It was a PA and I  did it today.  Elon Lomeli,cma

## 2021-06-08 NOTE — Telephone Encounter (Signed)
Steve Benjamin is calling for a peer to peer discussion with provider. Dr. Selinda Orion gave out other medications that as an alternative-FocalinXR-generic preferred and Methylphenidate-long acting ritalin [phone]-Dr. Selinda Orion

## 2021-06-14 MED ORDER — METHYLPHENIDATE HCL ER 36 MG PO TB24: 36.0000 mg | ORAL_TABLET | Freq: Every day | ORAL | 0 refills | Status: DC

## 2021-07-04 ENCOUNTER — Telehealth: Payer: Self-pay

## 2021-07-04 NOTE — Telephone Encounter (Signed)
The [employer] was not approved after doing a PA and a appeal.  Denial was placed in the lab basket.   Steve Benjamin,cma

## 2021-07-12 ENCOUNTER — Encounter: Payer: Self-pay | Admitting: Family Medicine

## 2021-07-12 ENCOUNTER — Ambulatory Visit (INDEPENDENT_AMBULATORY_CARE_PROVIDER_SITE_OTHER): Payer: Managed Care, Other (non HMO) | Admitting: Family Medicine

## 2021-07-12 DIAGNOSIS — F909 Attention-deficit hyperactivity disorder, unspecified type: Secondary | ICD-10-CM

## 2021-07-12 MED ORDER — METHYLPHENIDATE HCL ER 36 MG PO TB24: 72.0000 mg | ORAL_TABLET | Freq: Every day | ORAL | 0 refills | Status: DC

## 2021-07-12 NOTE — Progress Notes (Signed)
  Marikay Alar, MD Phone: [phone]  Steve Benjamin is a 31 y.o. male who presents today for f/u.  ADHD Medication: concerta 36 mg daily, took this for 1-2 weeks and then self increased to 72 mg daily prn, notes he has not been taking it every day, though when he does it works well at the 72 mg dose Effectiveness: see above Palpitations: no Sleep difficulty: no Appetite suppression: no Tolerating the 72 mg dose well.   Social History   Tobacco Use  Smoking Status Former   Packs/day: 1.00   Types: Cigarettes   Quit date: 08/15/2016   Years since quitting: 4.9  Smokeless Tobacco Former    No current outpatient medications on file prior to visit.   No current facility-administered medications on file prior to visit.     ROS see history of present illness  Objective  Physical Exam Vitals:   07/12/21 1035  BP: 110/80  Pulse: 82  Temp: 98.9 F (37.2 C)  SpO2: 98%    BP Readings from Last 3 Encounters:  07/12/21 110/80  05/31/21 120/80  10/26/20 130/80   Wt Readings from Last 3 Encounters:  07/12/21 176 lb 9.6 oz (80.1 kg)  05/31/21 174 lb 3.2 oz (79 kg)  10/26/20 171 lb (77.6 kg)    Physical Exam Constitutional:      General: He is not in acute distress.    Appearance: He is not diaphoretic.  Cardiovascular:     Rate and Rhythm: Normal rate and regular rhythm.     Heart sounds: Normal heart sounds.  Pulmonary:     Effort: Pulmonary effort is normal.     Breath sounds: Normal breath sounds.  Skin:    General: Skin is warm and dry.  Neurological:     Mental Status: He is alert.      Assessment/Plan: Please see individual problem list.  Problem List Items Addressed This Visit     ADHD (Chronic)    Adequately controlled on the 72 mg dose of Concerta.  I did advise the patient that he should not change medication doses on his own without conferring with the physician given the risk of adverse side effects.  I did discuss that there is  typically a dose between the 36 mg and 72 mg dosing that we go to first.  Given that he has been tolerating the 72 mg dose he will continue on Concerta 72 mg daily as needed.  He will monitor for side effects.  We will see him back in 3 months.      Relevant Medications   methylphenidate 36 MG PO CR tablet    Return in about 3 months (around 10/12/2021) for adhd.   Marikay Alar, MD [facility]

## 2021-07-12 NOTE — Assessment & Plan Note (Signed)
Adequately controlled on the 72 mg dose of Concerta.  I did advise the patient that he should not change medication doses on his own without conferring with the physician given the risk of adverse side effects.  I did discuss that there is typically a dose between the 36 mg and 72 mg dosing that we go to first.  Given that he has been tolerating the 72 mg dose he will continue on Concerta 72 mg daily as needed.  He will monitor for side effects.  We will see him back in 3 months.

## 2021-09-29 ENCOUNTER — Other Ambulatory Visit: Payer: Self-pay | Admitting: Family Medicine

## 2021-09-29 ENCOUNTER — Ambulatory Visit: Payer: Managed Care, Other (non HMO) | Admitting: Internal Medicine

## 2021-09-29 ENCOUNTER — Encounter: Payer: Self-pay | Admitting: Internal Medicine

## 2021-09-29 VITALS — BP 120/84 | HR 79 | Temp 98.3°F | Ht 68.0 in | Wt 174.2 lb

## 2021-09-29 DIAGNOSIS — L309 Dermatitis, unspecified: Secondary | ICD-10-CM | POA: Diagnosis not present

## 2021-09-29 DIAGNOSIS — L242 Irritant contact dermatitis due to solvents: Secondary | ICD-10-CM | POA: Diagnosis not present

## 2021-09-29 DIAGNOSIS — F909 Attention-deficit hyperactivity disorder, unspecified type: Secondary | ICD-10-CM

## 2021-09-29 DIAGNOSIS — L509 Urticaria, unspecified: Secondary | ICD-10-CM

## 2021-09-29 MED ORDER — HYDROXYZINE HCL 25 MG PO TABS: 25.0000 mg | ORAL_TABLET | Freq: Three times a day (TID) | ORAL | 1 refills | Status: DC | PRN

## 2021-09-29 MED ORDER — HYDROCORTISONE 2.5 % EX CREA: TOPICAL_CREAM | Freq: Two times a day (BID) | CUTANEOUS | 0 refills | Status: DC

## 2021-09-29 MED ORDER — METHYLPREDNISOLONE 4 MG PO TBPK: ORAL_TABLET | ORAL | 0 refills | Status: DC

## 2021-09-29 MED ORDER — METHYLPREDNISOLONE ACETATE 40 MG/ML IJ SUSP
40.0000 mg | Freq: Once | INTRAMUSCULAR | Status: AC
  Administered 2021-09-29: 40 mg via INTRAMUSCULAR

## 2021-09-29 MED ORDER — METHYLPHENIDATE HCL ER 36 MG PO TB24: 72.0000 mg | ORAL_TABLET | Freq: Every day | ORAL | 0 refills | Status: DC

## 2021-09-29 MED ORDER — TRIAMCINOLONE ACETONIDE 0.1 % EX CREA: 1.0000 | TOPICAL_CREAM | Freq: Two times a day (BID) | CUTANEOUS | 0 refills | Status: DC

## 2021-09-29 NOTE — Progress Notes (Signed)
40 mg of Depo medrol injection was administered to pt in left deltoid. Pt tolerated shot well.   --Lindaann Slough, CMA

## 2021-09-29 NOTE — Progress Notes (Signed)
Chief Complaint  Patient presents with   Urticaria    Pt broke out in hives on yesterday unsure of what caused his reaction   Acute Weds he had sewage leak in his basement and plumbers came but he had to clean it up with gloves, bleach and maybe sewage got in his eyes yesterday had itchy rash on face, arms, underarms a cough which resolved. He tried benadryl around the clock and allegra w/o relief itching worse Thursday pm 4-5 pm   He may have also come into contact with mold in his basement     Review of Systems  Constitutional:  Negative for fever and weight loss.  HENT:  Negative for hearing loss.   Eyes:  Negative for blurred vision.  Respiratory:  Negative for shortness of breath.   Cardiovascular:  Negative for chest pain.  Gastrointestinal:  Negative for abdominal pain and blood in stool.  Musculoskeletal:  Negative for back pain.  Skin:  Positive for itching and rash.  Neurological:  Negative for headaches.  Psychiatric/Behavioral:  Negative for depression.    Past Medical History:  Diagnosis Date   COVID-19    09/2019   Depression    Headache    Past Surgical History:  Procedure Laterality Date   NO PAST SURGERIES     Family History  Problem Relation Age of Onset   Arthritis Paternal Uncle    Hyperlipidemia Maternal Grandfather    Heart disease Maternal Grandfather    Cancer Paternal Grandfather    Heart disease Paternal Grandfather    Alzheimer's disease Paternal Grandfather    Social History   Socioeconomic History   Marital status: Married    Spouse name: Not on file   Number of children: Not on file   Years of education: Not on file   Highest education level: Not on file  Occupational History   Not on file  Tobacco Use   Smoking status: Former    Packs/day: 1.00    Types: Cigarettes    Quit date: 08/15/2016    Years since quitting: 5.1   Smokeless tobacco: Former  Building services engineer Use: Never used  Substance and Sexual Activity   Alcohol  use: Yes    Alcohol/week: 6.0 standard drinks of alcohol    Types: 6 Cans of beer per week   Drug use: No   Sexual activity: Not on file  Other Topics Concern   Not on file  Social History Narrative   Not on file   Social Determinants of Health   Financial Resource Strain: Not on file  Food Insecurity: Not on file  Transportation Needs: Not on file  Physical Activity: Not on file  Stress: Not on file  Social Connections: Not on file  Intimate Partner Violence: Not on file   Current Meds  Medication Sig   hydrocortisone 2.5 % cream Apply topically 2 (two) times daily. Prn face   hydrOXYzine (ATARAX) 25 MG tablet Take 1 tablet (25 mg total) by mouth every 8 (eight) hours as needed.   methylPREDNISolone (MEDROL DOSEPAK) 4 MG TBPK tablet Use as directed in am with food   triamcinolone cream (KENALOG) 0.1 % Apply 1 Application topically 2 (two) times daily. Avoid face   [DISCONTINUED] methylphenidate 36 MG PO CR tablet Take 2 tablets (72 mg total) by mouth daily.   No Known Allergies No results found for this or any previous visit (from the past 2160 hour(s)). Objective  Body mass index is 26.49 kg/m.  Wt Readings from Last 3 Encounters:  09/29/21 174 lb 3.2 oz (79 kg)  07/12/21 176 lb 9.6 oz (80.1 kg)  05/31/21 174 lb 3.2 oz (79 kg)   Temp Readings from Last 3 Encounters:  09/29/21 98.3 F (36.8 C) (Oral)  07/12/21 98.9 F (37.2 C) (Oral)  05/31/21 98.6 F (37 C) (Oral)   BP Readings from Last 3 Encounters:  09/29/21 120/84  07/12/21 110/80  05/31/21 120/80   Pulse Readings from Last 3 Encounters:  09/29/21 79  07/12/21 82  05/31/21 81    Physical Exam Vitals and nursing note reviewed.  Constitutional:      Appearance: Normal appearance. He is well-developed and well-groomed.  HENT:     Head: Normocephalic and atraumatic.  Eyes:     Conjunctiva/sclera: Conjunctivae normal.     Pupils: Pupils are equal, round, and reactive to light.  Cardiovascular:      Rate and Rhythm: Normal rate and regular rhythm.     Heart sounds: Normal heart sounds.  Pulmonary:     Effort: Pulmonary effort is normal. No respiratory distress.     Breath sounds: Normal breath sounds.  Abdominal:     Tenderness: There is no abdominal tenderness.  Skin:    General: Skin is warm and moist.     Findings: Rash present. Rash is urticarial.     Comments: Red slightly raised itchy hive like rash to face, arms, underarms, chest   Neurological:     General: No focal deficit present.     Mental Status: He is alert and oriented to person, place, and time. Mental status is at baseline.     Sensory: Sensation is intact.     Motor: Motor function is intact.     Coordination: Coordination is intact.     Gait: Gait is intact. Gait normal.  Psychiatric:        Attention and Perception: Attention and perception normal.        Mood and Affect: Mood and affect normal.        Speech: Speech normal.        Behavior: Behavior normal. Behavior is cooperative.        Thought Content: Thought content normal.        Cognition and Memory: Cognition and memory normal.        Judgment: Judgment normal.     Assessment  Plan  Hives - Plan: methylPREDNISolone acetate (DEPO-MEDROL) injection 40 mg, triamcinolone cream (KENALOG) 0.1 %, hydrocortisone 2.5 % cream, hydrOXYzine (ATARAX) 25 MG tablet, methylPREDNISolone (MEDROL DOSEPAK) 4 MG TBPK tablet  Dermatitis - Plan: triamcinolone cream (KENALOG) 0.1 %, hydrocortisone 2.5 % cream, hydrOXYzine (ATARAX) 25 MG tablet, methylPREDNISolone (MEDROL DOSEPAK) 4 MG TBPK tablet  Irritant contact dermatitis due to solvent  Depomedrol 40 x 1   Provider: Dr. French Ana McLean-Scocuzza-Internal Medicine

## 2021-09-29 NOTE — Patient Instructions (Addendum)
Oatmeal bath or aveeno skin relief or products with oatmeal   Hives Hives (urticaria) are itchy, red, swollen areas on the skin. Hives can appear on any part of the body. Hives often fade within 24 hours (acute hives). Sometimes, new hives appear after old ones fade and the cycle can continue for several days or weeks (chronic hives). Hives do not spread from person to person (are not contagious). Hives come from the body's reaction to something a person is allergic to (allergen), something that causes irritation, or various other triggers. When a person is exposed to a trigger, his or her body releases a chemical (histamine) that causes redness, itching, and swelling. Hives can appear right after exposure to a trigger or hours later. What are the causes? This condition may be caused by: Allergies to foods or ingredients. Insect bites or stings. Exposure to pollen or pets. Spending time in sunlight, heat, or cold (exposure). Exercise. Stress. You can also get hives from other medical conditions and treatments, such as: Viruses, including the common cold. Bacterial infections, such as urinary tract infections and strep throat. Certain medicines. Contact with latex or chemicals. Allergy shots. Blood transfusions. Sometimes, the cause of this condition is not known (idiopathic hives). What increases the risk? You are more likely to develop this condition if you: Are a woman. Have food allergies, especially to citrus fruits, milk, eggs, peanuts, tree nuts, or shellfish. Are allergic to: Medicines. Latex. Insects. Animals. Pollen. What are the signs or symptoms? Common symptoms of this condition include raised, itchy, red or white bumps or patches on your skin. These areas may: Become large and swollen (welts). Change in shape and location, quickly and repeatedly. Be separate hives or connect over a large area of skin. Sting or become painful. Turn white when pressed in the center  (blanch). In severe cases, your hands, feet, and face may also become swollen. This may occur if hives develop deeper in your skin. How is this diagnosed? This condition may be diagnosed by your symptoms, medical history, and physical exam. Your skin, urine, or blood may be tested to find out what is causing your hives and to rule out other health issues. Your health care provider may also remove a small sample of skin from the affected area and examine it under a microscope (biopsy). How is this treated? Treatment for this condition depends on the cause and severity of your symptoms. Your health care provider may recommend using cool, wet cloths (cool compresses) or taking cool showers to relieve itching. Treatment may include: Medicines that help: Relieve itching (antihistamines). Reduce swelling (corticosteroids). Treat infection (antibiotics). An injectable medicine (omalizumab). Your health care provider may prescribe this if you have chronic idiopathic hives and you continue to have symptoms even after treatment with antihistamines. Severe cases may require an emergency injection of adrenaline (epinephrine) to prevent a life-threatening allergic reaction (anaphylaxis). Follow these instructions at home: Medicines Take and apply over-the-counter and prescription medicines only as told by your health care provider. If you were prescribed an antibiotic medicine, take it as told by your health care provider. Do not stop using the antibiotic even if you start to feel better. Skin care Apply cool compresses to the affected areas. Do not scratch or rub your skin. General instructions Do not take hot showers or baths. This can make itching worse. Do not wear tight-fitting clothing. Use sunscreen and wear protective clothing when you are outside. Avoid any substances that cause your hives. Keep a journal to  help track what causes your hives. Write down: What medicines you take. What you eat  and drink. What products you use on your skin. Keep all follow-up visits as told by your health care provider. This is important. Contact a health care provider if: Your symptoms are not controlled with medicine. Your joints are painful or swollen. Get help right away if: You have a fever. You have pain in your abdomen. Your tongue or lips are swollen. Your eyelids are swollen. Your chest or throat feels tight. You have trouble breathing or swallowing. These symptoms may represent a serious problem that is an emergency. Do not wait to see if the symptoms will go away. Get medical help right away. Call your local emergency services (911 in the [place] Do not drive yourself to the hospital. Summary Hives (urticaria) are itchy, red, swollen areas on your skin. Hives come from the body's reaction to something a person is allergic to (allergen), something that causes irritation, or various other triggers. Treatment for this condition depends on the cause and severity of your symptoms. Avoid any substances that cause your hives. Keep a journal to help track what causes your hives. Take and apply over-the-counter and prescription medicines only as told by your health care provider. Get help right away if your chest or throat feels tight or if you have trouble breathing or swallowing. This information is not intended to replace advice given to you by your health care provider. Make sure you discuss any questions you have with your health care provider. Document Revised: 05/17/2021 Document Reviewed: 02/21/2020 Elsevier Patient Education  2023 [employer].

## 2021-10-18 ENCOUNTER — Ambulatory Visit: Payer: Managed Care, Other (non HMO) | Admitting: Family Medicine

## 2021-12-06 ENCOUNTER — Ambulatory Visit
Admission: EM | Admit: 2021-12-06 | Discharge: 2021-12-06 | Disposition: A | Discharge status: Home / Self Care | Payer: Managed Care, Other (non HMO) | Attending: Family Medicine | Admitting: Family Medicine

## 2021-12-06 DIAGNOSIS — K644 Residual hemorrhoidal skin tags: Secondary | ICD-10-CM

## 2021-12-06 DIAGNOSIS — R197 Diarrhea, unspecified: Secondary | ICD-10-CM

## 2021-12-06 MED ORDER — DICYCLOMINE HCL 20 MG PO TABS: 20.0000 mg | ORAL_TABLET | Freq: Four times a day (QID) | ORAL | 0 refills | Status: DC | PRN

## 2021-12-06 MED ORDER — DIPHENOXYLATE-ATROPINE 2.5-0.025 MG PO TABS: 1.0000 | ORAL_TABLET | Freq: Four times a day (QID) | ORAL | 0 refills | Status: DC | PRN

## 2021-12-06 MED ORDER — ACETAMINOPHEN-CODEINE 300-30 MG PO TABS: 1.0000 | ORAL_TABLET | Freq: Four times a day (QID) | ORAL | 0 refills | Status: DC | PRN

## 2021-12-06 MED ORDER — HYDROCORTISONE (PERIANAL) 2.5 % EX CREA: 1.0000 | TOPICAL_CREAM | Freq: Two times a day (BID) | CUTANEOUS | 0 refills | Status: DC

## 2021-12-06 NOTE — ED Provider Notes (Addendum)
[facility]    CSN: [id number] Arrival date & time: 12/06/21  1001      History   Chief Complaint Chief Complaint  Patient presents with   Diarrhea   Hemorrhoids    HPI Steve Benjamin is a 31 y.o. male.    Diarrhea  For diarrhea and painful hemorrhoids.  He had frequent diarrhea start on November 20th, and has had 15-20 stools a day.  Also 2 days ago he started having a painful hemorrhoid and he has had blood when he wipes.  He has taken Pepto and his stools have been dark.  He took some Imodium over-the-counter and then had worse cramping.  No fever or chills and no nausea or vomiting.  His son was sick 1 day with diarrhea right before he got his symptoms.  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 Authorizing Provider  acetaminophen-codeine (TYLENOL #3) 300-30 MG tablet Take 1 tablet by mouth every 6 (six) hours as needed (pain). 12/06/21  Yes Zenia Resides, MD  dicyclomine (BENTYL) 20 MG tablet Take 1 tablet (20 mg total) by mouth 4 (four) times daily as needed (intestinal cramps). 12/06/21  Yes Zenia Resides, MD  diphenoxylate-atropine (LOMOTIL) 2.5-0.025 MG tablet Take 1 tablet by mouth 4 (four) times daily as needed for diarrhea or loose stools. 12/06/21  Yes Zenia Resides, MD  hydrocortisone (ANUSOL-HC) 2.5 % rectal cream Place 1 Application rectally 2 (two) times daily. 12/06/21  Yes Zenia Resides, MD  methylphenidate 36  MG PO CR tablet Take 2 tablets (72 mg total) by mouth daily. 09/29/21  Yes Glori Luis, MD  hydrOXYzine (ATARAX) 25 MG tablet Take 1 tablet (25 mg total) by mouth every 8 (eight) hours as needed. 09/29/21   McLean-Scocuzza, Pasty Spillers, MD  triamcinolone cream (KENALOG) 0.1 % Apply 1 Application topically 2 (two) times daily. Avoid face 09/29/21   McLean-Scocuzza, Pasty Spillers, MD    Family History Family History  Problem Relation Age of Onset   Arthritis Paternal Uncle    Hyperlipidemia Maternal Grandfather    Heart disease Maternal Grandfather    Cancer Paternal Grandfather    Heart disease Paternal Grandfather    Alzheimer's disease Paternal Grandfather     Social History Social History   Tobacco Use   Smoking status: Former    Packs/day: 1.00    Types: Cigarettes    Quit date: 08/15/2016    Years since quitting: 5.3   Smokeless tobacco: Former  Building services engineer Use: Never used  Substance Use Topics   Alcohol use: Yes    Alcohol/week: 6.0 standard drinks of alcohol    Types: 6 Cans of beer per week   Drug use: No     Allergies   Patient has no known allergies.   Review of Systems Review of Systems  Gastrointestinal:  Positive for diarrhea.     Physical Exam Triage Vital Signs ED Triage Vitals  Enc Vitals Group     BP 12/06/21 1036 105/77     Pulse Rate 12/06/21 1036 (!) 101     Resp 12/06/21 1036 18     Temp 12/06/21 1036 98.4 F (36.9 C)     Temp src --      SpO2 12/06/21 1036 97 %     Weight 12/06/21 1042 165 lb (74.8 kg)     Height 12/06/21 1042 5\' 9"  (1.753 m)     Head Circumference --      Peak Flow --      Pain Score 12/06/21 1041 4     Pain Loc --      Pain Edu? --      Excl. in GC? --    No data found.  Updated Vital Signs BP 105/77   Pulse (!) 101   Temp 98.4 F (36.9 C)   Resp 18   Ht 5\' 9"  (1.753 m)   Wt 74.8 kg   SpO2 97%   BMI 24.37 kg/m   Visual Acuity Right Eye Distance:   Left Eye Distance:   Bilateral Distance:     Right Eye Near:   Left Eye Near:    Bilateral Near:     Physical Exam Vitals reviewed.  Constitutional:      General: He is not in acute distress.    Appearance: He is not ill-appearing, toxic-appearing or diaphoretic.  HENT:     Mouth/Throat:     Mouth: Mucous membranes are moist.  Cardiovascular:     Rate and Rhythm: Normal rate and regular rhythm.     Heart sounds: No murmur heard. Pulmonary:     Effort: Pulmonary effort is normal.     Breath sounds: Normal breath sounds.  Abdominal:     Palpations: Abdomen is soft. There is no mass.     Tenderness: There is no abdominal tenderness.  Genitourinary:    Comments: He has a hemorrhoid about 2 cm in size on the right aspect of his rectum.  It does not feel thrombosed Skin:    Capillary Refill: Capillary refill takes less than 2 seconds.     Coloration: Skin is not jaundiced or pale.  Neurological:     General: No focal deficit present.     Mental Status: He is alert and oriented to person, place, and time.  Psychiatric:        Behavior: Behavior normal.      UC Treatments / Results  Labs (all labs ordered are listed, but only abnormal results are displayed) Labs Reviewed - No data to display  EKG   Radiology No results found.  Procedures Procedures (including critical care time)  Medications Ordered in UC Medications - No data to display  Initial Impression / Assessment and Plan / UC Course  I have reviewed the triage vital signs and the nursing notes.  Pertinent labs & imaging results that were available during my care of the patient were reviewed by me and considered in my medical decision making (see chart for details).         Anusol was sent in for the hemorrhoid, and also Lomotil and Bentyl were sent in for the symptoms.  Stool studies ordered with the high frequency of the liquid stools he has been having. Final Clinical Impressions(s) / UC Diagnoses   Final diagnoses:  Diarrhea, unspecified  type  External hemorrhoid  Discharge Instructions      Put the hydrocortisone rectal cream on the hemorrhoid 4 times a day as needed  Eating ice on the hemorrhoid can help also  Dicyclomine--take 1 every 6 hours as needed for intestinal cramps   Tylenol with codeine--take 1 every 6 hours as needed for pain  Lomotil--1 tablet by mouth 4 times daily as needed for diarrhea       ED Prescriptions     Medication Sig Dispense Auth. Provider   hydrocortisone (ANUSOL-HC) 2.5 % rectal cream Place 1 Application rectally 2 (two) times daily. 30 g Zenia Resides, MD   acetaminophen-codeine (TYLENOL #3) 300-30 MG tablet Take 1 tablet by mouth every 6 (six) hours as needed (pain). 15 tablet Yael Angerer, Janace Aris, MD   diphenoxylate-atropine (LOMOTIL) 2.5-0.025 MG tablet Take 1 tablet by mouth 4 (four) times daily as needed for diarrhea or loose stools. 30 tablet Yuvaan Olander, Janace Aris, MD   dicyclomine (BENTYL) 20 MG tablet Take 1 tablet (20 mg total) by mouth 4 (four) times daily as needed (intestinal cramps). 20 tablet Alandis Bluemel, Janace Aris, MD      I have reviewed the PDMP during this encounter.   Zenia Resides, MD 12/06/21 1115    Zenia Resides, MD 12/06/21 [phone]

## 2021-12-06 NOTE — ED Triage Notes (Signed)
Patient to Urgent Care with complaints of diarrhea that started several days ago, reports frequently needing to use the bathroom. Patient reports that a hemorrhoid has appeared that is large and very painful, reports some bleeding when he wipes. Denies any nausea/ vomiting.  Has been using pepto, and otc anti-diarrhea medication. Reports abdominal cramping after taking the medication.

## 2021-12-06 NOTE — Discharge Instructions (Signed)
Put the hydrocortisone rectal cream on the hemorrhoid 4 times a day as needed  Eating ice on the hemorrhoid can help also  Dicyclomine--take 1 every 6 hours as needed for intestinal cramps   Tylenol with codeine--take 1 every 6 hours as needed for pain  Lomotil--1 tablet by mouth 4 times daily as needed for diarrhea

## 2022-02-12 ENCOUNTER — Encounter: Payer: Self-pay | Admitting: Family Medicine

## 2022-02-12 ENCOUNTER — Ambulatory Visit: Payer: BC Managed Care – PPO | Admitting: Family Medicine

## 2022-02-12 DIAGNOSIS — F909 Attention-deficit hyperactivity disorder, unspecified type: Secondary | ICD-10-CM | POA: Diagnosis not present

## 2022-02-12 MED ORDER — METHYLPHENIDATE HCL 10 MG PO TABS: 10.0000 mg | ORAL_TABLET | Freq: Two times a day (BID) | ORAL | 0 refills | Status: DC

## 2022-02-12 NOTE — Assessment & Plan Note (Signed)
We will change the patient to ritalin 10 mg BID. He will monitor for side effects and let me know if those occur. I will see him back in one month. Controlled substance database reviewed.

## 2022-02-12 NOTE — Patient Instructions (Signed)
Nice to see you. We will start you on ritalin 10 mg twice daily. I will see you back in about a month to see how it is going on this.  If you have appetite suppression, palpitations, or sleep issues with this medication please let me know.

## 2022-02-12 NOTE — Progress Notes (Signed)
  Tommi Rumps, MD Phone: [phone]  Steve Benjamin is a 32 y.o. male who presents today for F/u.  ADHD Medication: concerta Effectiveness: yes, though his work schedule is not conducive to the extended release version Palpitations: no Sleep difficulty: no Appetite suppression: no   Social History   Tobacco Use  Smoking Status Former   Packs/day: 1.00   Types: Cigarettes   Quit date: 08/15/2016   Years since quitting: 5.4  Smokeless Tobacco Former    Current Outpatient Medications on File Prior to Visit  Medication Sig Dispense Refill   acetaminophen-codeine (TYLENOL #3) 300-30 MG tablet Take 1 tablet by mouth every 6 (six) hours as needed (pain). (Patient not taking: Reported on 02/12/2022) 15 tablet 0   dicyclomine (BENTYL) 20 MG tablet Take 1 tablet (20 mg total) by mouth 4 (four) times daily as needed (intestinal cramps). (Patient not taking: Reported on 02/12/2022) 20 tablet 0   diphenoxylate-atropine (LOMOTIL) 2.5-0.025 MG tablet Take 1 tablet by mouth 4 (four) times daily as needed for diarrhea or loose stools. (Patient not taking: Reported on 02/12/2022) 30 tablet 0   hydrocortisone (ANUSOL-HC) 2.5 % rectal cream Place 1 Application rectally 2 (two) times daily. (Patient not taking: Reported on 02/12/2022) 30 g 0   hydrOXYzine (ATARAX) 25 MG tablet Take 1 tablet (25 mg total) by mouth every 8 (eight) hours as needed. (Patient not taking: Reported on 02/12/2022) 30 tablet 1   triamcinolone cream (KENALOG) 0.1 % Apply 1 Application topically 2 (two) times daily. Avoid face (Patient not taking: Reported on 02/12/2022) 454 g 0   No current facility-administered medications on file prior to visit.     ROS see history of present illness  Objective  Physical Exam Vitals:   02/12/22 1633  BP: 116/78  Pulse: 91  Resp: 16  Temp: 98 F (36.7 C)  SpO2: 99%    BP Readings from Last 3 Encounters:  02/12/22 116/78  12/06/21 105/77  09/29/21 120/84   Wt Readings from  Last 3 Encounters:  02/12/22 175 lb 6 oz (79.5 kg)  12/06/21 165 lb (74.8 kg)  09/29/21 174 lb 3.2 oz (79 kg)    Physical Exam Constitutional:      General: He is not in acute distress.    Appearance: He is not diaphoretic.  Cardiovascular:     Rate and Rhythm: Normal rate and regular rhythm.     Heart sounds: Normal heart sounds.  Pulmonary:     Effort: Pulmonary effort is normal.     Breath sounds: Normal breath sounds.  Skin:    General: Skin is warm and dry.  Neurological:     Mental Status: He is alert.      Assessment/Plan: Please see individual problem list.  Attention deficit hyperactivity disorder (ADHD), unspecified ADHD type Assessment & Plan: We will change the patient to ritalin 10 mg BID. He will monitor for side effects and let me know if those occur. I will see him back in one month. Controlled substance database reviewed.   Orders: -     Methylphenidate HCl; Take 1 tablet (10 mg total) by mouth 2 (two) times daily.  Dispense: 60 tablet; Refill: 0    Return in about 1 month (around 03/15/2022) for adhd.   Tommi Rumps, MD [place]

## 2022-05-23 ENCOUNTER — Telehealth: Payer: Self-pay | Admitting: Family Medicine

## 2022-05-23 NOTE — Telephone Encounter (Signed)
Prescription Request  05/23/2022  LOV: 02/12/2022  What is the name of the medication or equipment? methylphenidate (RITALIN) 10 MG tablet   Have you contacted your pharmacy to request a refill? No   Which pharmacy would you like this sent to?  [id number] [place], [place] - [address] [place] [address] Phone: [phone] Fax: [phone]    Patient notified that their request is being sent to the clinical staff for review and that they should receive a response within 2 business days.   Please advise at [facility] [phone]

## 2022-05-23 NOTE — Telephone Encounter (Signed)
He needs follow-up scheduled for me to refill this. Please get him scheduled. He was supposed to schedule a one month follow-up after his last visit.

## 2022-05-23 NOTE — Telephone Encounter (Signed)
Patient is scheduled for 05/24/22 at 11:00

## 2022-05-24 ENCOUNTER — Encounter: Payer: Self-pay | Admitting: Family Medicine

## 2022-05-24 ENCOUNTER — Ambulatory Visit: Payer: BC Managed Care – PPO | Admitting: Family Medicine

## 2022-05-24 VITALS — BP 116/74 | HR 81 | Temp 97.9°F | Ht 68.0 in | Wt 180.4 lb

## 2022-05-24 DIAGNOSIS — F909 Attention-deficit hyperactivity disorder, unspecified type: Secondary | ICD-10-CM

## 2022-05-24 MED ORDER — METHYLPHENIDATE HCL ER (OSM) 36 MG PO TBCR: 36.0000 mg | EXTENDED_RELEASE_TABLET | Freq: Every day | ORAL | 0 refills | Status: DC

## 2022-05-24 NOTE — Assessment & Plan Note (Signed)
Chronic issue.  We are going to switch him over to Concerta 36 mg once daily.  Discussed using extended release given that he is only taking his medication 1 time a day.  Controlled substance database reviewed.  Concerta sent to pharmacy.  Advised to contact us if he has any side effects.  He will let me know later in the month if this is effective or not and we can send in refills at that time if this dose works.

## 2022-05-24 NOTE — Progress Notes (Signed)
  Marikay Alar, MD Phone: [phone]  Steve Benjamin is a 32 y.o. male who presents today for f/u.  ADHD Medication: ritalin 30 mg once daily, mostly taking 3 tablets at a time though occasionally takes 2 tablets and then one later Effectiveness: only effective if he takes 3 tablets Palpitations: no Sleep difficulty: no Appetite suppression: no   Social History   Tobacco Use  Smoking Status Former   Packs/day: 1   Types: Cigarettes   Quit date: 08/15/2016   Years since quitting: 5.7  Smokeless Tobacco Former    No current outpatient medications on file prior to visit.   No current facility-administered medications on file prior to visit.     ROS see history of present illness  Objective  Physical Exam Vitals:   05/24/22 1106  BP: 116/74  Pulse: 81  Temp: 97.9 F (36.6 C)  SpO2: 99%    BP Readings from Last 3 Encounters:  05/24/22 116/74  02/12/22 116/78  12/06/21 105/77   Wt Readings from Last 3 Encounters:  05/24/22 180 lb 6.4 oz (81.8 kg)  02/12/22 175 lb 6 oz (79.5 kg)  12/06/21 165 lb (74.8 kg)    Physical Exam Constitutional:      General: He is not in acute distress.    Appearance: He is not diaphoretic.  Cardiovascular:     Rate and Rhythm: Normal rate and regular rhythm.     Heart sounds: Normal heart sounds.  Pulmonary:     Effort: Pulmonary effort is normal.     Breath sounds: Normal breath sounds.  Skin:    General: Skin is warm and dry.  Neurological:     Mental Status: He is alert.      Assessment/Plan: Please see individual problem list.  Attention deficit hyperactivity disorder (ADHD), unspecified ADHD type Assessment & Plan: Chronic issue.  We are going to switch him over to Concerta 36 mg once daily.  Discussed using extended release given that he is only taking his medication 1 time a day.  Controlled substance database reviewed.  Concerta sent to pharmacy.  Advised to contact us if he has any side effects.  He will  let me know later in the month if this is effective or not and we can send in refills at that time if this dose works.  Orders: -     Methylphenidate HCl ER (OSM); Take 1 tablet (36 mg total) by mouth daily.  Dispense: 30 tablet; Refill: 0     Return in about 3 months (around 08/24/2022) for adhd.   Marikay Alar, MD [facility]

## 2022-06-20 ENCOUNTER — Other Ambulatory Visit: Payer: Self-pay | Admitting: Family Medicine

## 2022-06-20 DIAGNOSIS — F909 Attention-deficit hyperactivity disorder, unspecified type: Secondary | ICD-10-CM

## 2022-06-20 NOTE — Telephone Encounter (Signed)
Prescription Request  06/20/2022  LOV: 05/24/2022  What is the name of the medication or equipment? methylphenidate (CONCERTA) 36 MG PO CR tablet  Have you contacted your pharmacy to request a refill? Yes   Which pharmacy would you like this sent to?  [id number] [place] [address], [place] - [address] [place] [address] Phone: [phone] Fax: [phone]    Patient notified that their request is being sent to the clinical staff for review and that they should receive a response within 2 business days.   Please advise at Mobile [phone] (mobile)

## 2022-06-22 MED ORDER — METHYLPHENIDATE HCL ER (OSM) 36 MG PO TBCR: 36.0000 mg | EXTENDED_RELEASE_TABLET | Freq: Every day | ORAL | 0 refills | Status: DC

## 2022-06-22 NOTE — Telephone Encounter (Signed)
Sent to pharmacy 

## 2022-06-22 NOTE — Addendum Note (Signed)
Addended by: Glori Luis on: 06/22/2022 09:49 AM   Modules accepted: Orders

## 2022-06-22 NOTE — Telephone Encounter (Signed)
Pt called in stating that [facility], and [facility] would [facility]. [address]

## 2022-06-25 NOTE — Telephone Encounter (Signed)
Prescription Request  06/25/2022  LOV: 05/24/2022  What is the name of the medication or equipment? methylphenidate   Have you contacted your pharmacy to request a refill? Yes   Which [facility] you [facility] [address]   Patient notified that their request is being sent to the clinical staff for review and that they should receive a response within 2 business days.   Please advise at Mobile [phone] (mobile)

## 2022-06-27 MED ORDER — METHYLPHENIDATE HCL ER (OSM) 36 MG PO TBCR: 36.0000 mg | EXTENDED_RELEASE_TABLET | Freq: Every day | ORAL | 0 refills | Status: DC

## 2022-06-27 NOTE — Addendum Note (Signed)
Addended by: Donavan Foil on: 06/27/2022 03:54 PM   Modules accepted: Orders

## 2022-06-27 NOTE — Telephone Encounter (Signed)
Medication pended along with the preferred pharmacy

## 2022-06-27 NOTE — Addendum Note (Signed)
Addended by: Donavan Foil on: 06/27/2022 11:50 AM   Modules accepted: Orders

## 2022-08-15 ENCOUNTER — Encounter (INDEPENDENT_AMBULATORY_CARE_PROVIDER_SITE_OTHER): Payer: Self-pay

## 2022-08-31 ENCOUNTER — Ambulatory Visit (INDEPENDENT_AMBULATORY_CARE_PROVIDER_SITE_OTHER): Payer: BC Managed Care – PPO | Admitting: Family Medicine

## 2022-08-31 ENCOUNTER — Encounter: Payer: Self-pay | Admitting: Family Medicine

## 2022-08-31 VITALS — BP 136/78 | HR 81 | Temp 98.6°F | Ht 68.5 in | Wt 172.0 lb

## 2022-08-31 DIAGNOSIS — F909 Attention-deficit hyperactivity disorder, unspecified type: Secondary | ICD-10-CM | POA: Diagnosis not present

## 2022-08-31 DIAGNOSIS — Z3009 Encounter for other general counseling and advice on contraception: Secondary | ICD-10-CM | POA: Diagnosis not present

## 2022-08-31 MED ORDER — LISDEXAMFETAMINE DIMESYLATE 20 MG PO CAPS: 40.0000 mg | ORAL_CAPSULE | Freq: Every day | ORAL | 0 refills | Status: DC

## 2022-08-31 NOTE — Progress Notes (Signed)
  Marikay Alar, MD Phone: [phone]  Steve Benjamin is a 32 y.o. male who presents today for f/u.  ADHD Medication: concerta Effectiveness: no Palpitations: no Sleep difficulty: no Appetite suppression: no Patient would like to go back to Vyvanse as it is generic now. Patient does drink 4 energy drinks per day and some coffee.  Notes the caffeine does not really bother him.  Patient would like a referral for vasectomy.  He has his third child on the way.   Social History   Tobacco Use  Smoking Status Former   Current packs/day: 0.00   Types: Cigarettes   Quit date: 08/15/2016   Years since quitting: 6.0  Smokeless Tobacco Former    No current outpatient medications on file prior to visit.   No current facility-administered medications on file prior to visit.     ROS see history of present illness  Objective  Physical Exam Vitals:   08/31/22 0923  BP: 136/78  Pulse: 81  Temp: 98.6 F (37 C)  SpO2: 99%    BP Readings from Last 3 Encounters:  08/31/22 136/78  05/24/22 116/74  02/12/22 116/78   Wt Readings from Last 3 Encounters:  08/31/22 172 lb (78 kg)  05/24/22 180 lb 6.4 oz (81.8 kg)  02/12/22 175 lb 6 oz (79.5 kg)    Physical Exam Constitutional:      General: He is not in acute distress.    Appearance: He is not diaphoretic.  Cardiovascular:     Rate and Rhythm: Normal rate and regular rhythm.     Heart sounds: Normal heart sounds.  Pulmonary:     Effort: Pulmonary effort is normal.     Breath sounds: Normal breath sounds.  Skin:    General: Skin is warm and dry.  Neurological:     Mental Status: He is alert.      Assessment/Plan: Please see individual problem list.  Attention deficit hyperactivity disorder (ADHD), unspecified ADHD type Assessment & Plan: Neck issue.  Poorly controlled.  Patient would like to switch back to Vyvanse as that is when he had the best luck with in the past.  Will start with Vyvanse 40 mg daily given  that he is already on Concerta.  In the past he was on Vyvanse 60 mg daily.  Patient will let me know in 1 to 2 weeks if the 40 mg dose is acceptable.  If it is not helpful we can increase the dose at that time.  Follow-up virtually in 1 month.  Orders: -     Lisdexamfetamine Dimesylate; Take 2 capsules (40 mg total) by mouth daily.  Dispense: 60 capsule; Refill: 0  Vasectomy evaluation Assessment & Plan: Refer to urology for evaluation.      Return in about 1 month (around 10/01/2022) for virtual ADHD follow-up.   Marikay Alar, MD [facility]

## 2022-08-31 NOTE — Assessment & Plan Note (Signed)
Neck issue.  Poorly controlled.  Patient would like to switch back to Vyvanse as that is when he had the best luck with in the past.  Will start with Vyvanse 40 mg daily given that he is already on Concerta.  In the past he was on Vyvanse 60 mg daily.  Patient will let me know in 1 to 2 weeks if the 40 mg dose is acceptable.  If it is not helpful we can increase the dose at that time.  Follow-up virtually in 1 month.

## 2022-08-31 NOTE — Assessment & Plan Note (Signed)
Refer to urology for evaluation

## 2022-08-31 NOTE — Patient Instructions (Signed)
Please let me know in 1 to 2 weeks if the 40 mg of Vyvanse is working well for you.

## 2022-10-08 ENCOUNTER — Encounter: Payer: Self-pay | Admitting: Family Medicine

## 2022-10-08 ENCOUNTER — Telehealth (INDEPENDENT_AMBULATORY_CARE_PROVIDER_SITE_OTHER): Payer: BC Managed Care – PPO | Admitting: Family Medicine

## 2022-10-08 DIAGNOSIS — F909 Attention-deficit hyperactivity disorder, unspecified type: Secondary | ICD-10-CM

## 2022-10-08 MED ORDER — LISDEXAMFETAMINE DIMESYLATE 60 MG PO CAPS: 60.0000 mg | ORAL_CAPSULE | Freq: Every day | ORAL | 0 refills | Status: DC

## 2022-10-08 NOTE — Progress Notes (Signed)
   Virtual Visit via video Note  I connected with Steve Benjamin today at  1:00 PM EDT by a video enabled telemedicine application and verified that I am speaking with the correct person using two identifiers. Location patient: home Location provider: work Persons participating in the virtual visit: patient, provider  I discussed the limitations, risks, security and privacy concerns of performing an evaluation and management service by telephone and the availability of in person appointments. I also discussed with the patient that there may be a patient responsible charge related to this service. The patient expressed understanding and agreed to proceed.  Reason for visit: f/u  HPI: ADHD Medication: vyvanse 40 mg Effectiveness: not quite enough Palpitations: no Sleep difficulty: no Appetite suppression: no    ROS: See pertinent positives and negatives per HPI.  Past Medical History:  Diagnosis Date   COVID-19    09/2019   Depression    Headache     Past Surgical History:  Procedure Laterality Date   NO PAST SURGERIES      Family History  Problem Relation Age of Onset   Arthritis Paternal Uncle    Hyperlipidemia Maternal Grandfather    Heart disease Maternal Grandfather    Cancer Paternal Grandfather    Heart disease Paternal Grandfather    Alzheimer's disease Paternal Grandfather     SOCIAL HX: former   Current Outpatient Medications:    lisdexamfetamine (VYVANSE) 60 MG capsule, Take 1 capsule (60 mg total) by mouth daily., Disp: 30 capsule, Rfl: 0  EXAM:  VITALS per patient if applicable:  GENERAL: alert, oriented, appears well and in no acute distress  HEENT: atraumatic, conjunttiva clear, no obvious abnormalities on inspection of external nose and ears  NECK: normal movements of the head and neck  LUNGS: on inspection no signs of respiratory distress, breathing rate appears normal, no obvious gross SOB, gasping or wheezing  CV: no obvious  cyanosis  MS: moves all visible extremities without noticeable abnormality  PSYCH/NEURO: pleasant and cooperative, no obvious depression or anxiety, speech and thought processing grossly intact  ASSESSMENT AND PLAN:  Discussed the following assessment and plan:  Problem List Items Addressed This Visit     ADHD (Chronic)    Chronic issue.  Suboptimally controlled.  Will increase Vyvanse to 60 mg daily.  Patient has been on this dose previously and tolerated this well.  If he notices any side effects he will let us know.  If is not lasting long enough we could always add something like Adderall 5 mg in the afternoon if needed.  He will follow-up in person in about a month.      Relevant Medications   lisdexamfetamine (VYVANSE) 60 MG capsule    Return in about 1 month (around 11/07/2022) for In person for ADHD.   I discussed the assessment and treatment plan with the patient. The patient was provided an opportunity to ask questions and all were answered. The patient agreed with the plan and demonstrated an understanding of the instructions.   The patient was advised to call back or seek an in-person evaluation if the symptoms worsen or if the condition fails to improve as anticipated.   Marikay Alar, MD

## 2022-10-08 NOTE — Assessment & Plan Note (Signed)
Chronic issue.  Suboptimally controlled.  Will increase Vyvanse to 60 mg daily.  Patient has been on this dose previously and tolerated this well.  If he notices any side effects he will let us know.  If is not lasting long enough we could always add something like Adderall 5 mg in the afternoon if needed.  He will follow-up in person in about a month.

## 2022-10-10 ENCOUNTER — Telehealth: Payer: Self-pay | Admitting: Family Medicine

## 2022-10-10 NOTE — Telephone Encounter (Signed)
Patient just called about his prescription name lisdexamfetamine (VYVANSE) 60 MG capsule. [facility]. They said it wasn't there yet. The patient just said the medication is on back order. Is it anything else he can get. His number is [phone]

## 2022-10-12 NOTE — Telephone Encounter (Signed)
Can you please call the pharmacy and see what the deal is with this?  Do they know when they are going to get this in stock?  Do they have the brand-name Vyvanse?

## 2022-10-12 NOTE — Telephone Encounter (Signed)
[facility] for the Vyvanse to get Patient his medication. [facility] states [facility] will [facility] with the brand name Vyvanse and they checked with Insurance. Called Patient to let him know it should be ready in about an hour and a half from what [facility] said.

## 2022-11-07 ENCOUNTER — Telehealth: Payer: Self-pay | Admitting: Family Medicine

## 2022-11-07 DIAGNOSIS — F909 Attention-deficit hyperactivity disorder, unspecified type: Secondary | ICD-10-CM

## 2022-11-07 MED ORDER — LISDEXAMFETAMINE DIMESYLATE 60 MG PO CAPS: 60.0000 mg | ORAL_CAPSULE | Freq: Every day | ORAL | 0 refills | Status: DC

## 2022-11-07 NOTE — Telephone Encounter (Signed)
I will send 1 refill in.  He was supposed to have follow-up scheduled after his last visit.  He may not have been contacted given that his last visit was a virtual visit.  Please get him scheduled for follow-up sometime in the next month.

## 2022-11-07 NOTE — Telephone Encounter (Signed)
Prescription Request  11/07/2022  LOV: 08/31/2022  What is the name of the medication or equipment? lisdexamfetamine   Have you contacted your pharmacy to request a refill? Yes   [facility]  Patient notified that their request is being sent to the clinical staff for review and that they should receive a response within 2 business days.   Please advise at Mobile [phone] (mobile)

## 2022-11-07 NOTE — Telephone Encounter (Signed)
Patient is scheduled for 12/05/22 at 9:00 and is aware of the one refill.

## 2022-11-08 ENCOUNTER — Other Ambulatory Visit: Payer: Self-pay | Admitting: Family

## 2022-11-14 ENCOUNTER — Telehealth: Payer: Self-pay

## 2022-11-14 NOTE — Telephone Encounter (Signed)
Patient states he has been experiencing weird stomach pain that started two weeks ago it was pretty intense and also had intense vomitting and it went away.  Patient states it happened again yesterday after he ate.  Patient states he just ate and it's starting to hurt just below his belly button.  Patient states he typically has nose bleeds when he vomits, which is pretty normal for him because his nose bleeds pretty easily.

## 2022-11-14 NOTE — Telephone Encounter (Signed)
Noted. Agree with advice given.

## 2022-11-14 NOTE — Telephone Encounter (Signed)
I accidentally closed tab for phone call.   I transferred call to Access Nurse.

## 2022-11-14 NOTE — Telephone Encounter (Signed)
Pt states he is feeling a lot better the pain has subsided some. Pt states he does has an appt on the 20th. Pt agreed to see Charan tomorrow. Pt stated when he ate today that's when he noticed the pain the most.  Advise pt that if his symptoms become worse to head to the ED.  Sending to pcp and [place]

## 2022-11-15 ENCOUNTER — Ambulatory Visit (INDEPENDENT_AMBULATORY_CARE_PROVIDER_SITE_OTHER): Payer: Medicaid Other | Admitting: Nurse Practitioner

## 2022-11-15 ENCOUNTER — Encounter: Payer: Self-pay | Admitting: Nurse Practitioner

## 2022-11-15 ENCOUNTER — Ambulatory Visit
Admission: RE | Admit: 2022-11-15 | Discharge: 2022-11-15 | Disposition: A | Discharge status: Home / Self Care | Payer: BC Managed Care – PPO | Source: Ambulatory Visit | Attending: Nurse Practitioner | Admitting: Nurse Practitioner

## 2022-11-15 VITALS — BP 118/74 | HR 98 | Temp 97.8°F | Ht 68.5 in | Wt 165.2 lb

## 2022-11-15 DIAGNOSIS — R1030 Lower abdominal pain, unspecified: Secondary | ICD-10-CM | POA: Insufficient documentation

## 2022-11-15 DIAGNOSIS — K5730 Diverticulosis of large intestine without perforation or abscess without bleeding: Secondary | ICD-10-CM | POA: Diagnosis not present

## 2022-11-15 NOTE — Assessment & Plan Note (Signed)
Tenderness of lower quadrant, positive rebound tenderness and Rovsign's sign.  Need imagining for further evaluation. Stat CT of abdomen ordered. Labs pending.

## 2022-11-15 NOTE — Progress Notes (Signed)
Established Patient Office Visit  Subjective:  Patient ID: Steve Benjamin, male    DOB: [birth year]  Age: 32 y.o. MRN: [id number]  CC:  Chief Complaint  Patient presents with   Acute Visit    Stomach pain & nausea x3-4 days  Had same issue a few weeks ago     HPI  Steve Benjamin presents for abdominal pain and nausea. Pt states that approximately 2 weeks ago he had severe vomiting and stomach pain that eventually subsided.  However his symptoms symptoms returned the day before yesterday after eating squash with rice.  He had vomiting and lower quadrant pain .  He reports no nausea today but still have lower abdominal pain. He has been using Pepto-Bismol with some improvement. HPI   Past Medical History:  Diagnosis Date   COVID-19    09/2019   Depression    Headache     Past Surgical History:  Procedure Laterality Date   NO PAST SURGERIES      Family History  Problem Relation Age of Onset   Arthritis Paternal Uncle    Hyperlipidemia Maternal Grandfather    Heart disease Maternal Grandfather    Cancer Paternal Grandfather    Heart disease Paternal Grandfather    Alzheimer's disease Paternal Grandfather     Social History   Socioeconomic History   Marital status: Married    Spouse name: Not on file   Number of children: Not on file   Years of education: Not on file   Highest education level: Not on file  Occupational History   Not on file  Tobacco Use   Smoking status: Former    Current packs/day: 0.00    Types: Cigarettes    Quit date: 08/15/2016    Years since quitting: 6.2   Smokeless tobacco: Former  Building services engineer status: Never Used  Substance and Sexual Activity   Alcohol use: Yes    Alcohol/week: 6.0 standard drinks of alcohol    Types: 6 Cans of beer per week   Drug use: No   Sexual activity: Not on file  Other Topics Concern   Not on file  Social History Narrative   Not on file   Social Determinants of Health   Financial  Resource Strain: Not on file  Food Insecurity: Not on file  Transportation Needs: Not on file  Physical Activity: Not on file  Stress: Not on file  Social Connections: Not on file  Intimate Partner Violence: Not on file     Outpatient Medications Prior to Visit  Medication Sig Dispense Refill   lisdexamfetamine (VYVANSE) 60 MG capsule Take 1 capsule (60 mg total) by mouth daily. 30 capsule 0   No facility-administered medications prior to visit.    No Known Allergies  ROS Review of Systems Negative unless indicated in HPI.    Objective:    Physical Exam Constitutional:      Appearance: Normal appearance.  Cardiovascular:     Rate and Rhythm: Normal rate and regular rhythm.     Pulses: Normal pulses.     Heart sounds: Normal heart sounds.  Abdominal:     General: Bowel sounds are normal.     Palpations: Abdomen is soft.     Tenderness: There is abdominal tenderness in the right lower quadrant and left lower quadrant. Positive signs include Rovsing's sign.     Comments: Rebound tenderness.  Musculoskeletal:     Cervical back: Normal range of motion.  Neurological:  General: No focal deficit present.     Mental Status: He is alert. Mental status is at baseline.  Psychiatric:        Mood and Affect: Mood normal.        Behavior: Behavior normal.        Thought Content: Thought content normal.        Judgment: Judgment normal.     BP 118/74   Pulse 98   Temp 97.8 F (36.6 C)   Ht 5' 8.5" (1.74 m)   Wt 165 lb 3.2 oz (74.9 kg)   SpO2 98%   BMI 24.75 kg/m  Wt Readings from Last 3 Encounters:  11/15/22 165 lb 3.2 oz (74.9 kg)  08/31/22 172 lb (78 kg)  05/24/22 180 lb 6.4 oz (81.8 kg)     Health Maintenance  Topic Date Due   HIV Screening  Never done   Hepatitis C Screening  Never done   INFLUENZA VACCINE  04/15/2023 (Originally 08/16/2022)   DTaP/Tdap/Td (2 - Td or Tdap) 08/11/2026   HPV VACCINES  Aged Out   COVID-19 Vaccine  Discontinued    There  are no preventive care reminders to display for this patient.  No results found for: "TSH" Lab Results  Component Value Date   WBC 10.0 04/03/2014   HGB 16.6 04/03/2014   HCT 49.0 04/03/2014   MCV 94.0 04/03/2014   PLT 315 04/03/2014   Lab Results  Component Value Date   NA 141 10/26/2020   K 4.5 10/26/2020   CO2 31 10/26/2020   GLUCOSE 68 (L) 10/26/2020   BUN 12 10/26/2020   CREATININE 0.89 10/26/2020   BILITOT 0.5 10/26/2020   ALKPHOS 48 10/26/2020   AST 20 10/26/2020   ALT 17 10/26/2020   PROT 7.5 10/26/2020   ALBUMIN 4.9 10/26/2020   CALCIUM 10.1 10/26/2020   ANIONGAP 9 04/03/2014   GFR 113.66 10/26/2020   Lab Results  Component Value Date   CHOL 217 (H) 10/26/2020   Lab Results  Component Value Date   HDL 67.10 10/26/2020   Lab Results  Component Value Date   LDLCALC 132 (H) 10/26/2020   Lab Results  Component Value Date   TRIG 92.0 10/26/2020   Lab Results  Component Value Date   CHOLHDL 3 10/26/2020   No results found for: "HGBA1C"    Assessment & Plan:  Lower abdominal pain Assessment & Plan: Tenderness of lower quadrant, positive rebound tenderness and Rovsign's sign.  Need imagining for further evaluation. Stat CT of abdomen ordered. Labs pending.     Orders: -     CT ABDOMEN PELVIS WO CONTRAST; Future -     CBC with Differential/Platelet -     Comprehensive metabolic panel    Follow-up: Return if symptoms worsen or fail to improve.   Kara Dies, NP

## 2022-11-16 LAB — COMPREHENSIVE METABOLIC PANEL
ALT: 15 U/L (ref 0–53)
AST: 16 U/L (ref 0–37)
Albumin: 4.6 g/dL (ref 3.5–5.2)
Alkaline Phosphatase: 56 U/L (ref 39–117)
BUN: 14 mg/dL (ref 6–23)
CO2: 32 meq/L (ref 19–32)
Calcium: 10.1 mg/dL (ref 8.4–10.5)
Chloride: 99 meq/L (ref 96–112)
Creatinine, Ser: 1.04 mg/dL (ref 0.40–1.50)
GFR: 93.87 mL/min (ref >60.00)
Glucose, Bld: 81 mg/dL (ref 70–99)
Potassium: 4.5 meq/L (ref 3.5–5.1)
Sodium: 139 meq/L (ref 135–145)
Total Bilirubin: 0.4 mg/dL (ref 0.2–1.2)
Total Protein: 7.3 g/dL (ref 6.0–8.3)

## 2022-11-16 LAB — CBC WITH DIFFERENTIAL/PLATELET
Basophils Absolute: 0 10*3/uL (ref 0.0–0.1)
Basophils Relative: 0.6 % (ref 0.0–3.0)
Eosinophils Absolute: 0.1 10*3/uL (ref 0.0–0.7)
Eosinophils Relative: 1.5 % (ref 0.0–5.0)
HCT: 41.4 % (ref 39.0–52.0)
Hemoglobin: 13.7 g/dL (ref 13.0–17.0)
Lymphocytes Relative: 46.1 % — ABNORMAL HIGH (ref 12.0–46.0)
Lymphs Abs: 2.3 10*3/uL (ref 0.7–4.0)
MCHC: 33.1 g/dL (ref 30.0–36.0)
MCV: 89.3 fL (ref 78.0–100.0)
Monocytes Absolute: 0.6 10*3/uL (ref 0.1–1.0)
Monocytes Relative: 10.8 % (ref 3.0–12.0)
Neutro Abs: 2.1 10*3/uL (ref 1.4–7.7)
Neutrophils Relative %: 41 % — ABNORMAL LOW (ref 43.0–77.0)
Platelets: 386 10*3/uL (ref 150.0–400.0)
RBC: 4.63 Mil/uL (ref 4.22–5.81)
RDW: 12.6 % (ref 11.5–15.5)
WBC: 5.1 10*3/uL (ref 4.0–10.5)

## 2022-11-16 NOTE — Progress Notes (Signed)
Please inform the pt the CT has no acute finding. There is sigmoid diverticulosis but not inflammatory changes.

## 2022-11-30 ENCOUNTER — Encounter: Payer: Self-pay | Admitting: Family Medicine

## 2022-12-05 ENCOUNTER — Ambulatory Visit: Payer: BC Managed Care – PPO | Admitting: Family Medicine

## 2022-12-17 ENCOUNTER — Telehealth: Payer: Self-pay | Admitting: Family Medicine

## 2022-12-17 DIAGNOSIS — F909 Attention-deficit hyperactivity disorder, unspecified type: Secondary | ICD-10-CM

## 2022-12-17 NOTE — Telephone Encounter (Signed)
Pt called to r/s last ov with Dr. Birdie Sons, pt states he was called & told the appt would need to be r/s. Pt r/s appt to virtual appt on 12/13. Pt requested a refill on meds?   Prescription Request  12/17/2022  LOV: 08/31/2022  What is the name of the medication or equipment? lisdexamfetamine (VYVANSE) 60 MG capsule   Have you contacted your pharmacy to request a refill? No   Which pharmacy would you like this sent to?  [id number] [place], [place] - [address] [place] [address] Phone: [phone] Fax: [phone]     Patient notified that their request is being sent to the clinical staff for review and that they should receive a response within 2 business days.   Please advise at Mobile [phone] (mobile)

## 2022-12-18 MED ORDER — LISDEXAMFETAMINE DIMESYLATE 60 MG PO CAPS: 60.0000 mg | ORAL_CAPSULE | Freq: Every day | ORAL | 0 refills | Status: DC

## 2022-12-18 NOTE — Telephone Encounter (Signed)
I will provide a refill though his next visit needs to be in person.  At least every other visit needs to be in person for this type of medication and his last visit was virtual.

## 2022-12-18 NOTE — Telephone Encounter (Signed)
Changed appointment to in person per Dr. Birdie Sons. Same day and time and Patient is aware.

## 2022-12-18 NOTE — Addendum Note (Signed)
Addended by: Glori Luis on: 12/18/2022 10:53 AM   Modules accepted: Orders

## 2022-12-28 ENCOUNTER — Encounter: Payer: Self-pay | Admitting: Family Medicine

## 2022-12-28 ENCOUNTER — Ambulatory Visit (INDEPENDENT_AMBULATORY_CARE_PROVIDER_SITE_OTHER): Payer: BC Managed Care – PPO | Admitting: Family Medicine

## 2022-12-28 VITALS — BP 122/80 | HR 94 | Temp 98.1°F | Ht 68.5 in | Wt 166.2 lb

## 2022-12-28 DIAGNOSIS — M79645 Pain in left finger(s): Secondary | ICD-10-CM | POA: Insufficient documentation

## 2022-12-28 DIAGNOSIS — K5790 Diverticulosis of intestine, part unspecified, without perforation or abscess without bleeding: Secondary | ICD-10-CM | POA: Diagnosis not present

## 2022-12-28 DIAGNOSIS — F909 Attention-deficit hyperactivity disorder, unspecified type: Secondary | ICD-10-CM | POA: Diagnosis not present

## 2022-12-28 HISTORY — DX: Diverticulosis of intestine, part unspecified, without perforation or abscess without bleeding: K57.90

## 2022-12-28 MED ORDER — LISDEXAMFETAMINE DIMESYLATE 60 MG PO CAPS: 60.0000 mg | ORAL_CAPSULE | Freq: Every day | ORAL | 0 refills | Status: DC

## 2022-12-28 NOTE — Progress Notes (Signed)
  Steve Alar, MD Phone: [phone]  Steve Benjamin is a 32 y.o. male who presents today for follow-up.  ADHD: Taking Vyvanse.  It is working well.  No appetite suppression, sleep changes, or palpitations.  Patient feels as though his heart rate might be up given that he just had a quadruple shot espresso drink.  Left middle finger complaint: Patient reports a dislocation of this finger 15 years ago.  He never got evaluated for it.  He reports intermittently having a numb shooting sensation from the PIP joint into the distal finger in his left middle finger.  He wonders if this could be carpal tunnel.  Diverticulosis/moderate stool: Noted on CT imaging previously.  Patient notes his bowel movements have gotten a little off around that time.  He is back to having bowel movements daily.  Social History   Tobacco Use  Smoking Status Former   Current packs/day: 0.00   Types: Cigarettes   Quit date: 08/15/2016   Years since quitting: 6.3  Smokeless Tobacco Former    No current outpatient medications on file prior to visit.   No current facility-administered medications on file prior to visit.     ROS see history of present illness  Objective  Physical Exam Vitals:   12/28/22 1342 12/28/22 1400  BP: 122/80   Pulse: (!) 105 94  Temp: 98.1 F (36.7 C)   SpO2: 99%     BP Readings from Last 3 Encounters:  12/28/22 122/80  11/15/22 118/74  08/31/22 136/78   Wt Readings from Last 3 Encounters:  12/28/22 166 lb 3.2 oz (75.4 kg)  11/15/22 165 lb 3.2 oz (74.9 kg)  08/31/22 172 lb (78 kg)    Physical Exam Constitutional:      General: He is not in acute distress.    Appearance: He is not diaphoretic.  Cardiovascular:     Rate and Rhythm: Normal rate and regular rhythm.     Heart sounds: Normal heart sounds.  Pulmonary:     Effort: Pulmonary effort is normal.     Breath sounds: Normal breath sounds.  Skin:    General: Skin is warm and dry.  Neurological:      Mental Status: He is alert.      Assessment/Plan: Please see individual problem list.  Attention deficit hyperactivity disorder (ADHD), unspecified ADHD type Assessment & Plan: Chronic issue.  Stable.  Patient can continue Vyvanse 60 mg daily.  Encouraged to reduce his caffeine intake.  Orders: -     Lisdexamfetamine Dimesylate; Take 1 capsule (60 mg total) by mouth daily.  Dispense: 30 capsule; Refill: 0  Pain of left middle finger Assessment & Plan: Patient with some shooting discomfort in his left middle finger.  Discussed this is likely nerve irritation based on his description.  Advised this is not consistent with carpal tunnel syndrome.  I offered referral to a hand specialist though he defers.  He will let me know if he changes his mind.   Diverticulosis Assessment & Plan: Discussed this diagnosis.  Discussed that there is nothing to do for this at this time.  Advised if he developed left lower quadrant pain he would need to get evaluated immediately.     Return in about 3 months (around 03/28/2023) for transfer of care.   Steve Alar, MD [facility]

## 2022-12-28 NOTE — Assessment & Plan Note (Signed)
Discussed this diagnosis.  Discussed that there is nothing to do for this at this time.  Advised if he developed left lower quadrant pain he would need to get evaluated immediately.

## 2022-12-28 NOTE — Assessment & Plan Note (Signed)
Chronic issue.  Stable.  Patient can continue Vyvanse 60 mg daily.  Encouraged to reduce his caffeine intake.

## 2022-12-28 NOTE — Assessment & Plan Note (Signed)
Patient with some shooting discomfort in his left middle finger.  Discussed this is likely nerve irritation based on his description.  Advised this is not consistent with carpal tunnel syndrome.  I offered referral to a hand specialist though he defers.  He will let me know if he changes his mind.

## 2023-01-14 ENCOUNTER — Telehealth: Payer: Self-pay | Admitting: Family Medicine

## 2023-01-14 NOTE — Telephone Encounter (Signed)
Patient would like a referral to get a cortisone injection for the pinched nerve that he has discussed with you Called patient. Patient would also like to know who you recommend for him to transfer to?

## 2023-01-14 NOTE — Telephone Encounter (Signed)
Copied from CRM [id number]. Topic: Clinical - Medical Advice >> Jan 14, 2023  4:10 PM Corin V wrote: Reason for CRM: Patient is wanting to know if anyone in the office does cortisone shots in office or if he will need to see a specialist for it. He stated he talked to Dr. Birdie Sons previously about a possible pinched nerve and he is wanting it treated now. He also stated that Dr. Birdie Sons told him he is leaving the practice and wants to know who Dr. Birdie Sons would recommend her transfer care to so he can schedule a follow up with them.

## 2023-01-15 NOTE — Telephone Encounter (Signed)
Left message to call the office back and also sent a my chart message.

## 2023-01-15 NOTE — Telephone Encounter (Signed)
 Please confirm if the request for a steroid shot is for his finger. I would recommend he see Dr Clent Ridges or Arville Lime.

## 2023-01-18 ENCOUNTER — Telehealth: Payer: Self-pay

## 2023-01-18 DIAGNOSIS — M79645 Pain in left finger(s): Secondary | ICD-10-CM

## 2023-01-18 DIAGNOSIS — F909 Attention-deficit hyperactivity disorder, unspecified type: Secondary | ICD-10-CM

## 2023-01-18 MED ORDER — LISDEXAMFETAMINE DIMESYLATE 60 MG PO CAPS: 60.0000 mg | ORAL_CAPSULE | Freq: Every day | ORAL | 0 refills | Status: DC

## 2023-01-18 NOTE — Telephone Encounter (Signed)
 Copied from CRM [id number]. Topic: Clinical - Medication Refill >> Jan 18, 2023  3:51 PM Rosina BIRCH wrote: Most Recent Primary Care Visit:  Provider: MARIBETH ERIC G  Department: [facility]  Visit Type: OFFICE VISIT  Date: 12/28/2022  Medication: VYVANSE   Has the patient contacted their pharmacy? No (Agent: If no, request that the patient contact the pharmacy for the refill. If patient does not wish to contact the pharmacy document the reason why and proceed with request.) (Agent: If yes, when and what did the pharmacy advise?)  Is this the correct pharmacy for this prescription? Yes If no, delete pharmacy and type the correct one.  This is the patient's preferred pharmacy:   [facility][id number] [place], [place] - [address] AT [facility] OF [address] [place] [address] Phone: [phone] Fax: [phone]   Has the prescription been filled recently? Yes  Is the patient out of the medication? No  Has the patient been seen for an appointment in the last year OR does the patient have an upcoming appointment? Yes  Can we respond through MyChart? No  Agent: Please be advised that Rx refills may take up to 3 business days. We ask that you follow-up with your pharmacy.

## 2023-01-18 NOTE — Telephone Encounter (Signed)
 Noted. I have placed a referral to a orthopedic hand specialist to get their input.

## 2023-01-18 NOTE — Addendum Note (Signed)
 Addended by: Glori Luis on: 01/18/2023 04:48 PM   Modules accepted: Orders

## 2023-01-18 NOTE — Telephone Encounter (Signed)
 Sent to pharmacy to fill on 01/28/23.

## 2023-01-18 NOTE — Addendum Note (Signed)
 Addended by: Glori Luis on: 01/18/2023 04:44 PM   Modules accepted: Orders

## 2023-01-18 NOTE — Telephone Encounter (Signed)
 Pt stated he is not out of medications yet but wanted to request due to pcp leaving at the end of the month.  Lov: 12/28/22  Nov: none, will be continuing care with Dr. Hope or Leron.    E-Prescribing Status: Receipt confirmed by pharmacy (12/28/2022  2:03 PM EST

## 2023-01-18 NOTE — Telephone Encounter (Signed)
 Copied from CRM [id number]. Topic: Referral - Question >> Jan 18, 2023  3:44 PM Truddie Crumble wrote: Reason for CRM: Pt called stating he would like to know about a referral message update about his finger

## 2023-01-29 ENCOUNTER — Telehealth: Payer: Self-pay

## 2023-01-29 NOTE — Telephone Encounter (Signed)
 Called Patient and let him know to call us back regarding a my chart message from 01/15/23.

## 2023-02-26 ENCOUNTER — Other Ambulatory Visit: Payer: Self-pay | Admitting: Family Medicine

## 2023-02-26 DIAGNOSIS — F909 Attention-deficit hyperactivity disorder, unspecified type: Secondary | ICD-10-CM

## 2023-02-26 NOTE — Telephone Encounter (Unsigned)
Copied from CRM [id number]. Topic: Clinical - Prescription Issue >> Feb 26, 2023  2:24 PM [name] wrote: Reason for CRM:  patient is in the process of TOC  from Dr. Birdie Sons to Dr. Heide Spark, appt scheduled 04/03/23 and is needing his his lisdexamfetamine (VYVANSE) 60 MG capsule [[id number]] refilled until his appt.  He is needing that sent to   [facility][id number] - [place], [place] - [address]

## 2023-02-27 ENCOUNTER — Telehealth: Payer: Self-pay

## 2023-02-27 MED ORDER — LISDEXAMFETAMINE DIMESYLATE 60 MG PO CAPS: 60.0000 mg | ORAL_CAPSULE | Freq: Every day | ORAL | 0 refills | Status: DC

## 2023-02-27 NOTE — Telephone Encounter (Signed)
I will refill for one month. Patient needs to establish with someone new that is not Dr Heide Spark.

## 2023-02-27 NOTE — Telephone Encounter (Signed)
Pt was scheduled to do a toc with Dr. Heide Spark. He does not see pcp pt's only acute. Please switch to a provider that is accepting pts.  Thx

## 2023-02-28 NOTE — Telephone Encounter (Signed)
Noted

## 2023-03-25 ENCOUNTER — Other Ambulatory Visit: Payer: Self-pay | Admitting: Nurse Practitioner

## 2023-03-25 DIAGNOSIS — F909 Attention-deficit hyperactivity disorder, unspecified type: Secondary | ICD-10-CM

## 2023-03-25 MED ORDER — LISDEXAMFETAMINE DIMESYLATE 60 MG PO CAPS: 60.0000 mg | ORAL_CAPSULE | Freq: Every day | ORAL | 0 refills | Status: DC

## 2023-03-25 NOTE — Telephone Encounter (Signed)
 Copied from CRM [id number]. Topic: Clinical - Medication Refill >> Mar 25, 2023 10:54 AM Almira Coaster wrote: Most Recent Primary Care Visit:  Provider: Birdie Sons ERIC G  Department: [facility]  Visit Type: OFFICE VISIT  Date: 12/28/2022  Medication: lisdexamfetamine (VYVANSE) 60 MG capsule  Has the patient contacted their pharmacy? No, patient would like for it to be sent to [facility] and they need [facility] (Agent: If no, request that the patient contact the pharmacy for the refill. If patient does not wish to contact the pharmacy document the reason why and proceed with request.) (Agent: If yes, when and what did the pharmacy advise?)  Is this the correct pharmacy for this prescription? Yes If no, delete pharmacy and type the correct one.  This is the patient's preferred pharmacy:    [facility] #[id number] [place], [place] - [address] AT [facility] OF [address] [place] [address] Phone: [phone] Fax: [phone]   Has the prescription been filled recently? No  Is the patient out of the medication? Yes  Has the patient been seen for an appointment in the last year OR does the patient have an upcoming appointment? Yes  Can we respond through MyChart? Yes  Agent: Please be advised that Rx refills may take up to 3 business days. We ask that you follow-up with your pharmacy.

## 2023-03-26 ENCOUNTER — Other Ambulatory Visit: Payer: Self-pay | Admitting: Family Medicine

## 2023-03-26 ENCOUNTER — Telehealth: Payer: Self-pay

## 2023-03-26 DIAGNOSIS — F909 Attention-deficit hyperactivity disorder, unspecified type: Secondary | ICD-10-CM

## 2023-03-26 MED ORDER — LISDEXAMFETAMINE DIMESYLATE 60 MG PO CAPS: 60.0000 mg | ORAL_CAPSULE | Freq: Every day | ORAL | 0 refills | Status: DC

## 2023-03-26 NOTE — Telephone Encounter (Signed)
 Copied from CRM [id number]. Topic: Clinical - Prescription Issue >> Mar 26, 2023 10:27 AM Mackie Pai E wrote: Reason for CRM: Patient called in regarding his lisdexamfetamine (VYVANSE) 60 MG capsule medication. Agent can see there was a medication refill for this prescription yesterday being sent over the [facility] but patient stated [facility], and he does not want [facility] anymore. Agent put [facility] as patient's preference in his chart. Callback number for patient is [phone] for clarification.

## 2023-03-26 NOTE — Telephone Encounter (Signed)
 Per [place] has [facility]. Pt has schedule an appointment in April.

## 2023-04-03 ENCOUNTER — Encounter: Payer: BC Managed Care – PPO | Admitting: Internal Medicine

## 2023-04-23 ENCOUNTER — Other Ambulatory Visit: Payer: Self-pay | Admitting: Nurse Practitioner

## 2023-04-23 DIAGNOSIS — F909 Attention-deficit hyperactivity disorder, unspecified type: Secondary | ICD-10-CM

## 2023-04-23 MED ORDER — LISDEXAMFETAMINE DIMESYLATE 60 MG PO CAPS: 60.0000 mg | ORAL_CAPSULE | Freq: Every day | ORAL | 0 refills | Status: DC

## 2023-04-23 NOTE — Telephone Encounter (Signed)
 Copied from CRM [id number]. Topic: Clinical - Medication Refill >> Apr 23, 2023 11:43 AM Efraim Kaufmann C wrote: Most Recent Primary Care Visit:  Provider: Birdie Sons ERIC G  Department: [facility]  Visit Type: OFFICE VISIT  Date: 12/28/2022  Medication: lisdexamfetamine (VYVANSE) 60 MG capsule  Has the patient contacted their pharmacy? Yes (Agent: If no, request that the patient contact the pharmacy for the refill. If patient does not wish to contact the pharmacy document the reason why and proceed with request.) (Agent: If yes, when and what did the pharmacy advise?)  Is this the correct pharmacy for this prescription? Yes If no, delete pharmacy and type the correct one.  This is the patient's preferred pharmacy:  [facility] #[id number] [place], [place] - [address] AT [facility] OF [address] [place] [address] Phone: [phone] Fax: [phone]   Has the prescription been filled recently? No  Is the patient out of the medication? No ( 2 days left)  Has the patient been seen for an appointment in the last year OR does the patient have an upcoming appointment? Yes (patient scheduled for 4/22)   Can we respond through MyChart? Yes  Agent: Please be advised that Rx refills may take up to 3 business days. We ask that you follow-up with your pharmacy.

## 2023-04-23 NOTE — Telephone Encounter (Signed)
 Has been on vyvanse. Reviewed PDMP. He has an appt scheduled 05/07/23. He will run out of vyvanse prior to appt. Will refill x 1, but he needs to keep the appt this month.

## 2023-05-07 ENCOUNTER — Encounter: Payer: BC Managed Care – PPO | Admitting: Nurse Practitioner

## 2023-05-07 NOTE — Progress Notes (Unsigned)
  Bluford Burkitt, NP-C Phone: [phone]  Steve Benjamin is a 33 y.o. male who presents today for ***  ***  Social History   Tobacco Use  Smoking Status Former   Current packs/day: 0.00   Types: Cigarettes   Quit date: 08/15/2016   Years since quitting: 6.7  Smokeless Tobacco Former    Current Outpatient Medications on File Prior to Visit  Medication Sig Dispense Refill   lisdexamfetamine (VYVANSE ) 60 MG capsule Take 1 capsule (60 mg total) by mouth daily. 30 capsule 0   No current facility-administered medications on file prior to visit.     ROS see history of present illness  Objective  Physical Exam There were no vitals filed for this visit.  BP Readings from Last 3 Encounters:  12/28/22 122/80  11/15/22 118/74  08/31/22 136/78   Wt Readings from Last 3 Encounters:  12/28/22 166 lb 3.2 oz (75.4 kg)  11/15/22 165 lb 3.2 oz (74.9 kg)  08/31/22 172 lb (78 kg)    Physical Exam   Assessment/Plan: Please see individual problem list.  There are no diagnoses linked to this encounter.   Health Maintenance: ***  No follow-ups on file.   Bluford Burkitt, NP-C [facility]

## 2023-05-30 ENCOUNTER — Other Ambulatory Visit: Payer: Self-pay | Admitting: Internal Medicine

## 2023-05-30 DIAGNOSIS — F909 Attention-deficit hyperactivity disorder, unspecified type: Secondary | ICD-10-CM

## 2023-05-30 NOTE — Telephone Encounter (Signed)
 Copied from CRM [id number]. Topic: Clinical - Medication Refill >> May 30, 2023 12:45 PM Steve Benjamin P wrote: Medication: lisdexamfetamine (VYVANSE ) 60 MG capsule  Has the patient contacted their pharmacy? Yes (Agent: If no, request that the patient contact the pharmacy for the refill. If patient does not wish to contact the pharmacy document the reason why and proceed with request.) (Agent: If yes, when and what did the pharmacy advise?)  This is the patient's preferred pharmacy:  [facility] #[id number] [place], [place] - [address] AT [facility] OF [address] [place] [address] Phone: [phone] Fax: [phone]  Is this the correct pharmacy for this prescription? Yes If no, delete pharmacy and type the correct one.   Has the prescription been filled recently? No  Is the patient out of the medication? Yes  Has the patient been seen for an appointment in the last year OR does the patient have an upcoming appointment? Yes  Can we respond through MyChart? Yes  Agent: Please be advised that Rx refills may take up to 3 business days. We ask that you follow-up with your pharmacy.

## 2023-05-31 NOTE — Telephone Encounter (Signed)
 Patient would like a call back regarding his medication he wanted to see if it could be refilled today

## 2023-06-04 NOTE — Telephone Encounter (Signed)
 Pt called and made appt with a new prov tomorrow at 200pm = please refuse this refill request as he is aware he will not get until he est with new provider.

## 2023-06-04 NOTE — Telephone Encounter (Signed)
 Left voicemail to notify pt that his appt has been changed to 1pm on Wed 5/21 with Dr Casimir Cleaver.   Pt must keep app for Vyvanse  refills.

## 2023-06-05 ENCOUNTER — Ambulatory Visit

## 2023-06-05 VITALS — BP 124/80 | HR 82 | Temp 98.6°F | Ht 68.5 in | Wt 170.4 lb

## 2023-06-05 DIAGNOSIS — M79645 Pain in left finger(s): Secondary | ICD-10-CM | POA: Diagnosis not present

## 2023-06-05 DIAGNOSIS — Z3009 Encounter for other general counseling and advice on contraception: Secondary | ICD-10-CM

## 2023-06-05 DIAGNOSIS — F909 Attention-deficit hyperactivity disorder, unspecified type: Secondary | ICD-10-CM

## 2023-06-05 DIAGNOSIS — D229 Melanocytic nevi, unspecified: Secondary | ICD-10-CM | POA: Diagnosis not present

## 2023-06-05 DIAGNOSIS — Z13220 Encounter for screening for lipoid disorders: Secondary | ICD-10-CM | POA: Diagnosis not present

## 2023-06-05 LAB — LIPID PANEL
Cholesterol: 266 mg/dL — ABNORMAL HIGH (ref 0–200)
HDL: 53.8 mg/dL (ref >39.00)
LDL Cholesterol: 195 mg/dL — ABNORMAL HIGH (ref 0–99)
NonHDL: 212.67
Total CHOL/HDL Ratio: 5
Triglycerides: 90 mg/dL (ref 0.0–149.0)
VLDL: 18 mg/dL (ref 0.0–40.0)

## 2023-06-05 LAB — COMPREHENSIVE METABOLIC PANEL WITH GFR
ALT: 34 U/L (ref 0–53)
AST: 22 U/L (ref 0–37)
Albumin: 4.9 g/dL (ref 3.5–5.2)
Alkaline Phosphatase: 60 U/L (ref 39–117)
BUN: 14 mg/dL (ref 6–23)
CO2: 27 meq/L (ref 19–32)
Calcium: 9.4 mg/dL (ref 8.4–10.5)
Chloride: 103 meq/L (ref 96–112)
Creatinine, Ser: 0.78 mg/dL (ref 0.40–1.50)
GFR: 116.14 mL/min (ref >60.00)
Glucose, Bld: 89 mg/dL (ref 70–99)
Potassium: 4 meq/L (ref 3.5–5.1)
Sodium: 138 meq/L (ref 135–145)
Total Bilirubin: 0.6 mg/dL (ref 0.2–1.2)
Total Protein: 7.4 g/dL (ref 6.0–8.3)

## 2023-06-05 MED ORDER — LISDEXAMFETAMINE DIMESYLATE 60 MG PO CAPS: 60.0000 mg | ORAL_CAPSULE | Freq: Every day | ORAL | 0 refills | Status: DC

## 2023-06-05 NOTE — Patient Instructions (Signed)
--   If the numbness, tinglings, pain on fingers gets worse I would like to reevaluate you, get x-ray and send you to neurology for nerve conduction study.   -- I am also referring you to urology to get evaluated for vasectomy. If you do not hear from urology within next 2 weeks please let us  know.   -- We are updating your labs today and I will reach out to you with lab results findings.   -- At any point if you feel like moles are getting bigger in size, changing in color, increasing in number please let us  know and we can refer you to dermatology.

## 2023-06-05 NOTE — Assessment & Plan Note (Signed)
 Mostly during winter months with occasional b/l tingling along both hands. Recommend ergonomic setup at work, frequent break, avoiding triggering activities. If persistent recommend x/ray, neurology referral for nerve conduction study.

## 2023-06-05 NOTE — Assessment & Plan Note (Signed)
Check lipid panel today 

## 2023-06-05 NOTE — Assessment & Plan Note (Signed)
 Patient has 3 kids. Has been interested in vasectomy for birth control. Urology referral made today.

## 2023-06-05 NOTE — Assessment & Plan Note (Signed)
 Check lipid panel

## 2023-06-05 NOTE — Assessment & Plan Note (Signed)
 Chronic issue.  Stable on Vyvanse  60 mg daily with occasional drug holiday on weekend.  PDMP reviewed, last refill with 30 tab was on 04/24/23. Refill sent today for 2 months.  Controlled substance agreement updated today.  Will update UDS today.

## 2023-06-05 NOTE — Progress Notes (Signed)
 Established Patient Office Visit   Subjective  Patient ID: Steve Benjamin, male    DOB: 08-01-1990  Age: 33 y.o. MRN: [id number]  Chief Complaint  Patient presents with   Transitions Of Care   Numbness    He  has a past medical history of COVID-19, Depression, Diverticulosis (12/28/2022), Foot pain (09/29/2019), Headache, Hypersomnia (12/28/2016), and Thoracic back pain (02/01/2020).  HPI 1) ADHD, mixed type:  - Diagnosed around when he was [age] years of age.  - On Vyvanse  60 mg once a day, takes break on weekend.  - He has been on this medication for about a year.  - No s/e like palpitation, chest pain. -  Last refill for 30 tab was filled on 04/24/23.   2) Is interested in vasectomy. He has 3 kids and would like to get a vasectomy.   3) Pain on left finger during winter months. Occasional numbness on both hands reports for a while. Works in [employer], types all day work.  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   ROS As per HPI    Objective:      BP 124/80   Pulse 82   Temp 98.6 F (37 C) (Oral)   Ht 5' 8.5" (1.74 m)   Wt 170 lb 6.4 oz (77.3 kg)   SpO2 98%   BMI 25.53 kg/m      06/05/2023    1:09 PM 12/28/2022    1:52 PM 11/15/2022    2:19 PM  Depression screen PHQ 2/9  Decreased Interest 0 0 0  Down, Depressed, Hopeless 0 0 0  PHQ - 2 Score 0 0 0  Altered sleeping 0 0 0  Tired, decreased energy 0 0 0  Change in appetite 0 0 0  Feeling bad or failure about yourself  0 0 0  Trouble concentrating 1 1 1   Moving slowly or fidgety/restless 0 0 1  Suicidal thoughts 0 0 0  PHQ-9 Score 1 1 2   Difficult doing work/chores Somewhat difficult Not difficult at all Not difficult at all      06/05/2023    1:10 PM 12/28/2022    1:52 PM 11/15/2022    2:20 PM 10/08/2022    1:05 PM  GAD 7 : Generalized Anxiety Score  Nervous, Anxious, on Edge 0 0 3 0  Control/stop worrying 0 0 1 0  Worry too much - different things 0 0 1 0  Trouble relaxing 0 1 1 0  Restless 0  0 0 0  Easily annoyed or irritable 0 0 0 0  Afraid - awful might happen 0 0 0 0  Total GAD 7 Score 0 1 6 0  Anxiety Difficulty Not difficult at all Not difficult at all Not difficult at all Not difficult at all      06/05/2023    1:09 PM 12/28/2022    1:52 PM 11/15/2022    2:19 PM  Depression screen PHQ 2/9  Decreased Interest 0 0 0  Down, Depressed, Hopeless 0 0 0  PHQ - 2 Score 0 0 0  Altered sleeping 0 0 0  Tired, decreased energy 0 0 0  Change in appetite 0 0 0  Feeling bad or failure about yourself  0 0 0  Trouble concentrating 1 1 1   Moving slowly or fidgety/restless 0 0 1  Suicidal thoughts 0 0 0  PHQ-9 Score 1 1 2   Difficult doing work/chores Somewhat difficult Not difficult at all Not difficult at all  06/05/2023    1:10 PM 12/28/2022    1:52 PM 11/15/2022    2:20 PM 10/08/2022    1:05 PM  GAD 7 : Generalized Anxiety Score  Nervous, Anxious, on Edge 0 0 3 0  Control/stop worrying 0 0 1 0  Worry too much - different things 0 0 1 0  Trouble relaxing 0 1 1 0  Restless 0 0 0 0  Easily annoyed or irritable 0 0 0 0  Afraid - awful might happen 0 0 0 0  Total GAD 7 Score 0 1 6 0  Anxiety Difficulty Not difficult at all Not difficult at all Not difficult at all Not difficult at all   SDOH Screenings   Depression (PHQ2-9): Low Risk  (06/05/2023)  Tobacco Use: Medium Risk (06/05/2023)     Physical Exam Constitutional:      Appearance: Normal appearance.  HENT:     Head: Normocephalic and atraumatic.     Right Ear: Tympanic membrane normal.     Left Ear: Tympanic membrane normal.     Mouth/Throat:     Mouth: Mucous membranes are moist.  Neck:     Thyroid: No thyroid mass or thyroid tenderness.  Cardiovascular:     Rate and Rhythm: Normal rate and regular rhythm.     Heart sounds: No murmur heard. Pulmonary:     Effort: Pulmonary effort is normal.     Breath sounds: Normal breath sounds.  Abdominal:     General: Bowel sounds are normal.     Palpations:  Abdomen is soft.  Musculoskeletal:     Cervical back: Neck supple. No rigidity.     Right lower leg: No edema.     Left lower leg: No edema.  Skin:    General: Skin is warm.     Comments: Multiple pigmented nevi on patient's back as documented on imaging from today's visit.  Neurological:     Mental Status: He is alert and oriented to person, place, and time.  Psychiatric:        Mood and Affect: Mood normal.        Behavior: Behavior normal.        No results found for any visits on 06/05/23.  The ASCVD Risk score (Arnett DK, et al., 2019) failed to calculate for the following reasons:   The 2019 ASCVD risk score is only valid for ages 27 to 18    Assessment & Plan:  Attention deficit hyperactivity disorder (ADHD), unspecified ADHD type Assessment & Plan: Chronic issue.  Stable on Vyvanse  60 mg daily with occasional drug holiday on weekend.  PDMP reviewed, last refill with 30 tab was on 04/24/23. Refill sent today for 2 months.  Controlled substance agreement updated today.  Will update UDS today.    Orders: -     Lisdexamfetamine Dimesylate ; Take 1 capsule (60 mg total) by mouth daily.  Dispense: 60 capsule; Refill: 0 -     Comprehensive metabolic panel with GFR -     Drug Screen 10 W/Conf, Serum -     ToxASSURE Select 13 (MW), Urine  Lipid screening Assessment & Plan: Check lipid panel today.  Orders: -     Lipid panel  Vasectomy evaluation Assessment & Plan: Check lipid panel today.  Orders: -     Ambulatory referral to Urology  Numerous moles Assessment & Plan: Dermatology referral made today.  Orders: -     Ambulatory referral to Dermatology  Pain of left middle finger Assessment &  Plan: Mostly during winter months with occasional b/l tingling along both hands. Recommend ergonomic setup at work, frequent break, avoiding triggering activities. If persistent recommend x/ray, neurology referral for nerve conduction study.    Screening for lipoid  disorders Assessment & Plan: Check lipid panel   Encounter for vasectomy counseling Assessment & Plan: Patient has 3 kids. Has been interested in vasectomy for birth control. Urology referral made today.    Return in about 3 months (around 09/05/2023) for ADHD follow up .   Jacklin Mascot, MD

## 2023-06-05 NOTE — Assessment & Plan Note (Deleted)
 Check lipid panel

## 2023-06-05 NOTE — Assessment & Plan Note (Signed)
Dermatology referral made today. 

## 2023-06-06 ENCOUNTER — Ambulatory Visit: Payer: Self-pay

## 2023-06-06 DIAGNOSIS — E7800 Pure hypercholesterolemia, unspecified: Secondary | ICD-10-CM | POA: Insufficient documentation

## 2023-06-07 NOTE — Progress Notes (Signed)
 Patient has not read mychart message from 06/06/2023. Please call him to convey message mychart message I sent him on 06/06/23 with results and recommendations.   Thank you,  Jacklin Mascot, MD

## 2023-06-10 LAB — TOXASSURE SELECT 13 (MW), URINE

## 2023-06-11 MED ORDER — ROSUVASTATIN CALCIUM 20 MG PO TABS: 20.0000 mg | ORAL_TABLET | Freq: Every day | ORAL | 1 refills | Status: AC

## 2023-06-11 NOTE — Progress Notes (Signed)
 Medication and future labs sent. Mychart message sent to the patient as well.    1. Pure hypercholesterolemia  rosuvastatin  (CRESTOR ) 20 MG tablet; Take 1 tablet (20 mg total) by mouth daily.  Dispense: 30 tablet; Refill: 1  Lipid panel; Future  Hepatic function panel; Future

## 2023-07-22 ENCOUNTER — Other Ambulatory Visit: Payer: Self-pay

## 2023-07-22 DIAGNOSIS — F909 Attention-deficit hyperactivity disorder, unspecified type: Secondary | ICD-10-CM

## 2023-07-22 MED ORDER — LISDEXAMFETAMINE DIMESYLATE 60 MG PO CAPS
60.0000 mg | ORAL_CAPSULE | Freq: Every day | ORAL | 0 refills | Status: AC
Start: 2023-07-29 — End: ?

## 2023-07-22 MED ORDER — LISDEXAMFETAMINE DIMESYLATE 60 MG PO CAPS
60.0000 mg | ORAL_CAPSULE | Freq: Every day | ORAL | 0 refills | Status: AC
Start: 2023-08-21 — End: ?

## 2023-07-22 MED ORDER — LISDEXAMFETAMINE DIMESYLATE 60 MG PO CAPS: 60.0000 mg | ORAL_CAPSULE | Freq: Every day | ORAL | 0 refills | Status: DC

## 2023-07-22 NOTE — Telephone Encounter (Unsigned)
 Copied from CRM [id number]. Topic: Clinical - Medication Refill >> Jul 22, 2023  1:11 PM Montie POUR wrote: Medication: lisdexamfetamine (VYVANSE ) 60 MG capsule  Has the patient contacted their pharmacy? Yes (Agent: If no, request that the patient contact the pharmacy for the refill. If patient does not wish to contact the pharmacy document the reason why and proceed with request.) (Agent: If yes, when and what did the pharmacy advise?) Pharmacy needs an order to refill  This is the patient's preferred pharmacy:  [id number] [place], [place] - [address] AT [facility] OF [address] [place] [address] Phone: [phone] Fax: [phone]  Is this the correct pharmacy for this prescription? Yes If no, delete pharmacy and type the correct one.   Has the prescription been filled recently? No  Is the patient out of the medication? No  Has the patient been seen for an appointment in the last year OR does the patient have an upcoming appointment? Yes  Can we respond through MyChart? No  Agent: Please be advised that Rx refills may take up to 3 business days. We ask that you follow-up with your pharmacy.

## 2023-07-22 NOTE — Progress Notes (Signed)
 1. Attention deficit hyperactivity disorder (ADHD), unspecified ADHD type - PDMP reviewed refill not sue till 08/04/23. Following refill sent with pharmacy request to refill below medications when patient is due for refill.  - lisdexamfetamine (VYVANSE ) 60 MG capsule; Take 1 capsule (60 mg total) by mouth daily.  Dispense: 30 capsule; Refill: 0 - lisdexamfetamine (VYVANSE ) 60 MG capsule; Take 1 capsule (60 mg total) by mouth daily.  Dispense: 30 capsule; Refill: 0 - lisdexamfetamine (VYVANSE ) 60 MG capsule; Take 1 capsule (60 mg total) by mouth daily.  Dispense: 30 capsule; Refill: 0   Luke Shade, MD

## 2023-07-22 NOTE — Telephone Encounter (Signed)
 06/05/23 60 tabs/0 RF

## 2024-01-03 ENCOUNTER — Other Ambulatory Visit: Payer: Self-pay

## 2024-01-03 DIAGNOSIS — F909 Attention-deficit hyperactivity disorder, unspecified type: Secondary | ICD-10-CM

## 2024-01-03 MED ORDER — LISDEXAMFETAMINE DIMESYLATE 60 MG PO CAPS: 60.0000 mg | ORAL_CAPSULE | Freq: Every day | ORAL | 0 refills | Status: DC

## 2024-01-03 NOTE — Telephone Encounter (Signed)
 1. Attention deficit hyperactivity disorder (ADHD), unspecified ADHD type - PDMP reviewed, refill appropriate - lisdexamfetamine  (VYVANSE ) 60 MG capsule; Take 1 capsule (60 mg total) by mouth daily.  Dispense: 30 capsule; Refill: 0 - lisdexamfetamine  (VYVANSE ) 60 MG capsule; Take 1 capsule (60 mg total) by mouth daily.  Dispense: 30 capsule; Refill: 0 - lisdexamfetamine  (VYVANSE ) 60 MG capsule; Take 1 capsule (60 mg total) by mouth daily.  Dispense: 30 capsule; Refill: 0  Steve Shade, MD

## 2024-01-03 NOTE — Telephone Encounter (Signed)
 Copied from CRM #[id number]. Topic: Clinical - Medication Refill >> Jan 03, 2024 10:55 AM Cynthia K wrote: Medication:  lisdexamfetamine  (VYVANSE ) 60 MG capsule  Has the patient contacted their pharmacy? Yes (Agent: If no, request that the patient contact the pharmacy for the refill. If patient does not wish to contact the pharmacy document the reason why and proceed with request.) (Agent: If yes, when and what did the pharmacy advise?) Pharmacy needs order to refill  This is the patient's preferred pharmacy:  [facility] #[id number] [place], [place] - [address] AT [facility] OF [address] [place] [address] Phone: [phone] Fax: [phone]  Is this the correct pharmacy for this prescription? Yes If no, delete pharmacy and type the correct one.   Has the prescription been filled recently? No  Is the patient out of the medication? Yes  Has the patient been seen for an appointment in the last year OR does the patient have an upcoming appointment? Yes - First available was 04/13/24  Can we respond through MyChart? Yes  Agent: Please be advised that Rx refills may take up to 3 business days. We ask that you follow-up with your pharmacy.

## 2024-01-06 ENCOUNTER — Other Ambulatory Visit (HOSPITAL_COMMUNITY): Payer: Self-pay

## 2024-02-11 ENCOUNTER — Telehealth: Payer: Self-pay

## 2024-02-11 DIAGNOSIS — F909 Attention-deficit hyperactivity disorder, unspecified type: Secondary | ICD-10-CM

## 2024-02-11 MED ORDER — LISDEXAMFETAMINE DIMESYLATE 60 MG PO CAPS: 60.0000 mg | ORAL_CAPSULE | Freq: Every day | ORAL | 0 refills | Status: AC

## 2024-02-11 NOTE — Telephone Encounter (Signed)
 Copied from CRM #[id number]. Topic: Clinical - Prescription Issue >> Feb 11, 2024 11:42 AM Laymon HERO wrote: Reason for CRM: patients insurance will not cover Vyvance medication at [facility]- wanting to have prescription to [facility] listed in pharmacies

## 2024-02-11 NOTE — Telephone Encounter (Signed)
 1. Attention deficit hyperactivity disorder (ADHD), unspecified ADHD type (Primary) - [place]  PDMP reviewed.  Last refill for Vyvanse  60 mg, 30 tablet was done on 01/03/2024.  Due to insurance not covering prescription refill at [facility] Vyvanse  refill sent to [facility] pharmacy at patient's request. - lisdexamfetamine  (VYVANSE ) 60 MG capsule; Take 1 capsule (60 mg total) by mouth daily.  Dispense: 30 capsule; Refill: 0 - lisdexamfetamine  (VYVANSE ) 60 MG capsule; Take 1 capsule (60 mg total) by mouth daily.  Dispense: 30 capsule; Refill: 0   Luke Shade, MD

## 2024-02-11 NOTE — Addendum Note (Signed)
 Addended by: Jeret Goyer on: 02/11/2024 01:05 PM   Modules accepted: Orders

## 2024-02-11 NOTE — Telephone Encounter (Signed)
 Noted.

## 2024-02-17 ENCOUNTER — Other Ambulatory Visit (HOSPITAL_COMMUNITY): Payer: Self-pay

## 2024-04-13 ENCOUNTER — Encounter

## 2024-04-13 ENCOUNTER — Ambulatory Visit
